# Patient Record
Sex: Female | Born: 1935 | Race: White | Hispanic: No | Marital: Married | Smoking: Never smoker
Health system: Southern US, Community
[De-identification: ages and names within clinical notes are randomized; demographics above are authoritative.]

## PROBLEM LIST (undated history)

## (undated) DIAGNOSIS — E119 Type 2 diabetes mellitus without complications: Secondary | ICD-10-CM

## (undated) DIAGNOSIS — R569 Unspecified convulsions: Secondary | ICD-10-CM

## (undated) DIAGNOSIS — I219 Acute myocardial infarction, unspecified: Secondary | ICD-10-CM

## (undated) DIAGNOSIS — Z8489 Family history of other specified conditions: Secondary | ICD-10-CM

## (undated) DIAGNOSIS — C801 Malignant (primary) neoplasm, unspecified: Secondary | ICD-10-CM

## (undated) DIAGNOSIS — M1990 Unspecified osteoarthritis, unspecified site: Secondary | ICD-10-CM

## (undated) DIAGNOSIS — R51 Headache: Secondary | ICD-10-CM

## (undated) DIAGNOSIS — I10 Essential (primary) hypertension: Secondary | ICD-10-CM

## (undated) DIAGNOSIS — I2510 Atherosclerotic heart disease of native coronary artery without angina pectoris: Secondary | ICD-10-CM

## (undated) DIAGNOSIS — T4145XA Adverse effect of unspecified anesthetic, initial encounter: Secondary | ICD-10-CM

## (undated) DIAGNOSIS — K859 Acute pancreatitis without necrosis or infection, unspecified: Secondary | ICD-10-CM

## (undated) DIAGNOSIS — F419 Anxiety disorder, unspecified: Secondary | ICD-10-CM

## (undated) DIAGNOSIS — T8859XA Other complications of anesthesia, initial encounter: Secondary | ICD-10-CM

## (undated) DIAGNOSIS — R519 Headache, unspecified: Secondary | ICD-10-CM

## (undated) HISTORY — PX: CARDIOVASCULAR STRESS TEST: SHX262

## (undated) HISTORY — PX: DILATION AND CURETTAGE OF UTERUS: SHX78

## (undated) HISTORY — PX: EYE SURGERY: SHX253

## (undated) HISTORY — PX: TRANSTHORACIC ECHOCARDIOGRAM: SHX275

---

## 2009-03-07 HISTORY — PX: CORONARY STENT PLACEMENT: SHX1402

## 2009-03-07 HISTORY — PX: CARDIAC CATHETERIZATION: SHX172

## 2015-05-30 ENCOUNTER — Emergency Department (HOSPITAL_COMMUNITY): Payer: Medicare Other

## 2015-05-30 ENCOUNTER — Inpatient Hospital Stay (HOSPITAL_COMMUNITY)
Admission: EM | Admit: 2015-05-30 | Discharge: 2015-06-03 | DRG: 443 | Disposition: A | Discharge status: Home / Self Care | Payer: Medicare Other | Attending: Internal Medicine | Admitting: Internal Medicine

## 2015-05-30 ENCOUNTER — Encounter (HOSPITAL_COMMUNITY): Payer: Self-pay

## 2015-05-30 DIAGNOSIS — Z7902 Long term (current) use of antithrombotics/antiplatelets: Secondary | ICD-10-CM

## 2015-05-30 DIAGNOSIS — R7989 Other specified abnormal findings of blood chemistry: Secondary | ICD-10-CM | POA: Diagnosis not present

## 2015-05-30 DIAGNOSIS — Z7984 Long term (current) use of oral hypoglycemic drugs: Secondary | ICD-10-CM

## 2015-05-30 DIAGNOSIS — E119 Type 2 diabetes mellitus without complications: Secondary | ICD-10-CM | POA: Diagnosis present

## 2015-05-30 DIAGNOSIS — I10 Essential (primary) hypertension: Secondary | ICD-10-CM | POA: Diagnosis present

## 2015-05-30 DIAGNOSIS — B179 Acute viral hepatitis, unspecified: Principal | ICD-10-CM | POA: Diagnosis present

## 2015-05-30 DIAGNOSIS — Z7982 Long term (current) use of aspirin: Secondary | ICD-10-CM

## 2015-05-30 DIAGNOSIS — Z955 Presence of coronary angioplasty implant and graft: Secondary | ICD-10-CM

## 2015-05-30 DIAGNOSIS — R7401 Elevation of levels of liver transaminase levels: Secondary | ICD-10-CM | POA: Diagnosis present

## 2015-05-30 DIAGNOSIS — K7200 Acute and subacute hepatic failure without coma: Secondary | ICD-10-CM | POA: Diagnosis not present

## 2015-05-30 DIAGNOSIS — Z01810 Encounter for preprocedural cardiovascular examination: Secondary | ICD-10-CM | POA: Diagnosis not present

## 2015-05-30 DIAGNOSIS — K810 Acute cholecystitis: Secondary | ICD-10-CM | POA: Diagnosis not present

## 2015-05-30 DIAGNOSIS — G40909 Epilepsy, unspecified, not intractable, without status epilepticus: Secondary | ICD-10-CM

## 2015-05-30 DIAGNOSIS — I2510 Atherosclerotic heart disease of native coronary artery without angina pectoris: Secondary | ICD-10-CM | POA: Diagnosis not present

## 2015-05-30 DIAGNOSIS — R740 Nonspecific elevation of levels of transaminase and lactic acid dehydrogenase [LDH]: Secondary | ICD-10-CM | POA: Diagnosis not present

## 2015-05-30 DIAGNOSIS — R1013 Epigastric pain: Secondary | ICD-10-CM | POA: Diagnosis not present

## 2015-05-30 DIAGNOSIS — E1100 Type 2 diabetes mellitus with hyperosmolarity without nonketotic hyperglycemic-hyperosmolar coma (NKHHC): Secondary | ICD-10-CM | POA: Diagnosis not present

## 2015-05-30 DIAGNOSIS — R945 Abnormal results of liver function studies: Secondary | ICD-10-CM

## 2015-05-30 HISTORY — DX: Essential (primary) hypertension: I10

## 2015-05-30 HISTORY — DX: Unspecified convulsions: R56.9

## 2015-05-30 HISTORY — DX: Acute pancreatitis without necrosis or infection, unspecified: K85.90

## 2015-05-30 HISTORY — DX: Type 2 diabetes mellitus without complications: E11.9

## 2015-05-30 HISTORY — DX: Atherosclerotic heart disease of native coronary artery without angina pectoris: I25.10

## 2015-05-30 LAB — I-STAT TROPONIN, ED: TROPONIN I, POC: 0 ng/mL (ref 0.00–0.08)

## 2015-05-30 LAB — CBC WITH DIFFERENTIAL/PLATELET
BASOS ABS: 0 10*3/uL (ref 0.0–0.1)
Basophils Relative: 0 %
EOS ABS: 0 10*3/uL (ref 0.0–0.7)
EOS PCT: 0 %
HCT: 37.9 % (ref 36.0–46.0)
Hemoglobin: 13.1 g/dL (ref 12.0–15.0)
LYMPHS ABS: 0.7 10*3/uL (ref 0.7–4.0)
Lymphocytes Relative: 6 %
MCH: 30.8 pg (ref 26.0–34.0)
MCHC: 34.6 g/dL (ref 30.0–36.0)
MCV: 89 fL (ref 78.0–100.0)
Monocytes Absolute: 0.8 10*3/uL (ref 0.1–1.0)
Monocytes Relative: 7 %
Neutro Abs: 10 10*3/uL — ABNORMAL HIGH (ref 1.7–7.7)
Neutrophils Relative %: 87 %
PLATELETS: 210 10*3/uL (ref 150–400)
RBC: 4.26 MIL/uL (ref 3.87–5.11)
RDW: 12.9 % (ref 11.5–15.5)
WBC: 11.5 10*3/uL — AB (ref 4.0–10.5)

## 2015-05-30 LAB — COMPREHENSIVE METABOLIC PANEL
ALT: 435 U/L — AB (ref 14–54)
AST: 799 U/L — ABNORMAL HIGH (ref 15–41)
Albumin: 3.9 g/dL (ref 3.5–5.0)
Alkaline Phosphatase: 91 U/L (ref 38–126)
Anion gap: 12 (ref 5–15)
BUN: 10 mg/dL (ref 6–20)
CHLORIDE: 96 mmol/L — AB (ref 101–111)
CO2: 22 mmol/L (ref 22–32)
CREATININE: 0.82 mg/dL (ref 0.44–1.00)
Calcium: 9.8 mg/dL (ref 8.9–10.3)
GFR calc non Af Amer: >60 mL/min (ref >60)
Glucose, Bld: 209 mg/dL — ABNORMAL HIGH (ref 65–99)
Potassium: 4.3 mmol/L (ref 3.5–5.1)
SODIUM: 130 mmol/L — AB (ref 135–145)
Total Bilirubin: 3.5 mg/dL — ABNORMAL HIGH (ref 0.3–1.2)
Total Protein: 7 g/dL (ref 6.5–8.1)

## 2015-05-30 LAB — LIPASE, BLOOD: Lipase: 29 U/L (ref 11–51)

## 2015-05-30 LAB — PROTIME-INR
INR: 1.18 (ref 0.00–1.49)
PROTHROMBIN TIME: 15.2 s (ref 11.6–15.2)

## 2015-05-30 MED ORDER — SODIUM CHLORIDE 0.9 % IV SOLN
INTRAVENOUS | Status: DC
  Administered 2015-05-31: 01:00:00 via INTRAVENOUS

## 2015-05-30 MED ORDER — LOSARTAN POTASSIUM 50 MG PO TABS
100.0000 mg | ORAL_TABLET | Freq: Every day | ORAL | Status: DC
  Administered 2015-05-31 – 2015-06-03 (×4): 100 mg via ORAL
  Filled 2015-05-30 (×4): qty 2

## 2015-05-30 MED ORDER — GABAPENTIN 100 MG PO CAPS
100.0000 mg | ORAL_CAPSULE | Freq: Every day | ORAL | Status: DC
  Administered 2015-05-31 – 2015-06-02 (×4): 100 mg via ORAL
  Filled 2015-05-30 (×4): qty 1

## 2015-05-30 MED ORDER — SENNOSIDES-DOCUSATE SODIUM 8.6-50 MG PO TABS
1.0000 | ORAL_TABLET | Freq: Every day | ORAL | Status: DC
  Administered 2015-05-31 – 2015-06-02 (×3): 1 via ORAL
  Filled 2015-05-30 (×3): qty 1

## 2015-05-30 MED ORDER — PANTOPRAZOLE SODIUM 40 MG PO TBEC
40.0000 mg | DELAYED_RELEASE_TABLET | Freq: Every day | ORAL | Status: DC
  Administered 2015-05-31 – 2015-06-03 (×4): 40 mg via ORAL
  Filled 2015-05-30 (×4): qty 1

## 2015-05-30 MED ORDER — INSULIN ASPART 100 UNIT/ML ~~LOC~~ SOLN
0.0000 [IU] | SUBCUTANEOUS | Status: DC
  Administered 2015-05-31: 2 [IU] via SUBCUTANEOUS

## 2015-05-30 MED ORDER — ASPIRIN EC 81 MG PO TBEC
81.0000 mg | DELAYED_RELEASE_TABLET | Freq: Every day | ORAL | Status: DC
  Administered 2015-05-31 – 2015-06-02 (×3): 81 mg via ORAL
  Filled 2015-05-30 (×3): qty 1

## 2015-05-30 MED ORDER — SODIUM CHLORIDE 1 G PO TABS
1.0000 g | ORAL_TABLET | ORAL | Status: DC
Start: 2015-05-31 — End: 2015-05-31
  Filled 2015-05-30: qty 1

## 2015-05-30 MED ORDER — METOPROLOL SUCCINATE ER 50 MG PO TB24
50.0000 mg | ORAL_TABLET | Freq: Every day | ORAL | Status: DC
  Administered 2015-05-31 – 2015-06-03 (×4): 50 mg via ORAL
  Filled 2015-05-30 (×4): qty 1

## 2015-05-30 MED ORDER — ONDANSETRON HCL 4 MG/2ML IJ SOLN: 4.0000 mg | Freq: Four times a day (QID) | INTRAMUSCULAR | Status: DC | PRN

## 2015-05-30 MED ORDER — CLOPIDOGREL BISULFATE 75 MG PO TABS
75.0000 mg | ORAL_TABLET | Freq: Every day | ORAL | Status: DC
  Administered 2015-05-31: 75 mg via ORAL
  Filled 2015-05-30: qty 1

## 2015-05-30 MED ORDER — ENOXAPARIN SODIUM 40 MG/0.4ML ~~LOC~~ SOLN
40.0000 mg | SUBCUTANEOUS | Status: DC
  Administered 2015-05-31 – 2015-06-02 (×2): 40 mg via SUBCUTANEOUS
  Filled 2015-05-30 (×2): qty 0.4

## 2015-05-30 MED ORDER — IOPAMIDOL (ISOVUE-300) INJECTION 61%
INTRAVENOUS | Status: AC
  Administered 2015-05-30: 100 mL
  Filled 2015-05-30: qty 100

## 2015-05-30 MED ORDER — METOCLOPRAMIDE HCL 5 MG/ML IJ SOLN
5.0000 mg | Freq: Once | INTRAMUSCULAR | Status: AC
  Administered 2015-05-30: 5 mg via INTRAVENOUS
  Filled 2015-05-30: qty 2

## 2015-05-30 MED ORDER — ADULT MULTIVITAMIN W/MINERALS CH
1.0000 | ORAL_TABLET | Freq: Every day | ORAL | Status: DC
  Administered 2015-06-01 – 2015-06-03 (×3): 1 via ORAL
  Filled 2015-05-30 (×3): qty 1

## 2015-05-30 MED ORDER — SODIUM CHLORIDE 0.9 % IV BOLUS (SEPSIS)
1000.0000 mL | Freq: Once | INTRAVENOUS | Status: AC
  Administered 2015-05-30: 1000 mL via INTRAVENOUS

## 2015-05-30 MED ORDER — CYCLOBENZAPRINE HCL 5 MG PO TABS: 5.0000 mg | ORAL_TABLET | Freq: Three times a day (TID) | ORAL | Status: DC | PRN

## 2015-05-30 MED ORDER — LEVETIRACETAM 500 MG PO TABS
500.0000 mg | ORAL_TABLET | Freq: Two times a day (BID) | ORAL | Status: DC
  Administered 2015-05-31 – 2015-06-03 (×8): 500 mg via ORAL
  Filled 2015-05-30 (×2): qty 1
  Filled 2015-05-30: qty 2
  Filled 2015-05-30 (×3): qty 1
  Filled 2015-05-30: qty 2
  Filled 2015-05-30: qty 1

## 2015-05-30 MED ORDER — CLONAZEPAM 0.5 MG PO TABS: 0.5000 mg | ORAL_TABLET | Freq: Two times a day (BID) | ORAL | Status: DC | PRN

## 2015-05-30 NOTE — ED Provider Notes (Signed)
Complains of generalized weakness, nausea and vomiting 1 time and epigastric pain onset this morning. Last bowel movement this morning was normal. She was evaluated by an after hours clinic earlier today determined to have elevated liver function tests and sent here for further evaluation. Presently abdominal discomfort is minimal. She does feel mildly nauseated. She denies any chest pain. On exam no distress lungs clear auscultation heart regular rate and rhythm abdomen nondistended normal active bowel sounds minimal tenderness at epigastrium no guarding rigidity or rebound  Orlie Dakin, MD 05/30/15 2021

## 2015-05-30 NOTE — ED Provider Notes (Signed)
CSN: [id number]     Arrival date & time 05/30/15  1906 History   First MD Initiated Contact with Patient 05/30/15 1930     Chief Complaint  Patient presents with  . Abnormal Lab      HPI 82 y.o. female with history of hypertension, DM 2, and pancreatitis back in December of this year of unknown etiology, he presents with generalized weakness, nausea, vomiting, epigastric pain for the past day. Patient was seen by her primary care doctor this morning and perform labs that showed elevated liver enzymes, prompting him to request the patient be seen in the emergency department. She reports that her epigastric abdominal pain is completely resolved. Reports normal bowel movements. Denies diarrhea or constipation. No blood in her stool or black tarry stools. Vomiting has been nonbloody nonbilious. She has not tolerated oral intake throughout the day today. Denies fevers, right upper quadrant pain, right lower quadrant pain, history of abdominal surgeries. Denies dysuria, frequency, chest pain, shortness of breath.   Past Medical History  Diagnosis Date  . Hypertension   . Diabetes mellitus without complication ([place])   . Seizures ([employer])    History reviewed. No pertinent past surgical history. History reviewed. No pertinent family history. Social History  Substance Use Topics  . Smoking status: Never Smoker   . Smokeless tobacco: Never Used  . Alcohol Use: No   OB History    No data available     Review of Systems  Constitutional: Negative for fever, chills, activity change and appetite change.  HENT: Negative for congestion, rhinorrhea and sore throat.   Eyes: Negative for visual disturbance.  Respiratory: Negative for cough, shortness of breath and wheezing.   Cardiovascular: Negative for chest pain and palpitations.  Gastrointestinal: Positive for nausea, vomiting and abdominal pain. Negative for diarrhea, constipation, blood in stool and abdominal distention.  Genitourinary: Negative  for dysuria, frequency and flank pain.  Musculoskeletal: Negative for myalgias, back pain, joint swelling, arthralgias, gait problem, neck pain and neck stiffness.  Skin: Negative for rash.  Neurological: Positive for weakness (generalized). Negative for dizziness, tremors, syncope, facial asymmetry, speech difficulty, numbness and headaches.  Psychiatric/Behavioral: Negative for behavioral problems, confusion and agitation.      Allergies  Penicillins  Home Medications   Prior to Admission medications   Medication Sig Start Date End Date Taking? Authorizing Provider  aspirin EC 81 MG tablet Take 81 mg by mouth daily.   Yes Historical Provider, MD  clonazePAM (KLONOPIN) 0.5 MG tablet Take 0.5 mg by mouth 2 (two) times daily as needed for anxiety.   Yes Historical Provider, MD  clopidogrel (PLAVIX) 75 MG tablet Take 75 mg by mouth daily.   Yes Historical Provider, MD  cyclobenzaprine (FLEXERIL) 5 MG tablet Take 5 mg by mouth 3 (three) times daily as needed for muscle spasms.   Yes Historical Provider, MD  gabapentin (NEURONTIN) 100 MG capsule Take 100 mg by mouth at bedtime.   Yes Historical Provider, MD  levETIRAcetam (KEPPRA) 500 MG tablet Take 500 mg by mouth 2 (two) times daily.   Yes Historical Provider, MD  losartan (COZAAR) 100 MG tablet Take 100 mg by mouth daily.   Yes Historical Provider, MD  metFORMIN (GLUCOPHAGE) 500 MG tablet Take 500 mg by mouth 2 (two) times daily with a meal.   Yes Historical Provider, MD  metoprolol succinate (TOPROL-XL) 50 MG 24 hr tablet Take 50 mg by mouth daily. Take with or immediately following a meal.   Yes Historical Provider,  MD  Multiple Vitamin (MULTIVITAMIN WITH MINERALS) TABS tablet Take 1 tablet by mouth daily with lunch.   Yes Historical Provider, MD  omeprazole (PRILOSEC) 20 MG capsule Take 20 mg by mouth daily.   Yes Historical Provider, MD  ondansetron (ZOFRAN) 4 MG tablet Take 4 mg by mouth every 8 (eight) hours as needed for nausea or  vomiting.   Yes Historical Provider, MD  OVER THE COUNTER MEDICATION Place 1 drop into both eyes daily as needed (dry eyes). OTC lubricating eye drop   Yes Historical Provider, MD  senna-docusate (SENOKOT-S) 8.6-50 MG tablet Take 1 tablet by mouth at bedtime.   Yes Historical Provider, MD  sodium chloride 1 g tablet Take 1 g by mouth every other day.   Yes Historical Provider, MD  spironolactone (ALDACTONE) 25 MG tablet Take 25 mg by mouth daily.   Yes Historical Provider, MD   BP 104/62 mmHg  Pulse 90  Temp(Src) 99.4 F (37.4 C) (Oral)  Resp 26  SpO2 97% Physical Exam  Constitutional: She is oriented to person, place, and time. She appears well-developed and well-nourished. No distress.  HENT:  Head: Normocephalic and atraumatic.  Right Ear: External ear normal.  Left Ear: External ear normal.  Nose: Nose normal.  Mouth/Throat: Oropharynx is clear and moist. No oropharyngeal exudate.  Eyes: Conjunctivae and EOM are normal. Pupils are equal, round, and reactive to light. Right eye exhibits no discharge. Left eye exhibits no discharge.  Neck: Normal range of motion. Neck supple.  Cardiovascular: Normal rate, regular rhythm and normal heart sounds.  Exam reveals no gallop and no friction rub.   No murmur heard. Pulmonary/Chest: Breath sounds normal. No respiratory distress. She has no wheezes. She has no rales.  Abdominal: Soft. Bowel sounds are normal. She exhibits no distension and no mass. There is tenderness. There is no rebound (mild TTP over the epigastrium) and no guarding.  Musculoskeletal: Normal range of motion. She exhibits no edema or tenderness.  Neurological: She is alert and oriented to person, place, and time. She exhibits normal muscle tone.  Skin: Skin is warm and dry. No rash noted. She is not diaphoretic.  Psychiatric: She has a normal mood and affect. Her behavior is normal. Judgment and thought content normal.    ED Course  Procedures (including critical care  time) Labs Review Labs Reviewed  CBC WITH DIFFERENTIAL/PLATELET - Abnormal; Notable for the following:    WBC 11.5 (*)    Neutro Abs 10.0 (*)    All other components within normal limits  COMPREHENSIVE METABOLIC PANEL - Abnormal; Notable for the following:    Sodium 130 (*)    Chloride 96 (*)    Glucose, Bld 209 (*)    AST 799 (*)    ALT 435 (*)    Total Bilirubin 3.5 (*)    All other components within normal limits  LIPASE, BLOOD  PROTIME-INR  HEPATITIS PANEL, ACUTE  URINALYSIS, ROUTINE W REFLEX MICROSCOPIC (NOT AT [facility])  EPSTEIN-BARR VIRUS VCA, IGG  EPSTEIN-BARR VIRUS VCA, IGM  CMV IGM  COMPREHENSIVE METABOLIC PANEL  I-STAT TROPOININ, ED    Imaging Review Ct Abdomen Pelvis W Contrast  05/30/2015  CLINICAL DATA:  Epigastric abdominal pain and nausea and vomiting today. Elevated liver enzymes at a walk in clinic. EXAM: CT ABDOMEN AND PELVIS WITH CONTRAST TECHNIQUE: Multidetector CT imaging of the abdomen and pelvis was performed using the standard protocol following bolus administration of intravenous contrast. CONTRAST:  1 ISOVUE-300 IOPAMIDOL (ISOVUE-300) INJECTION 61% COMPARISON:  None. FINDINGS: Tiny subpleural nodules and calcified granulomas demonstrated in the lung bases, likely postinflammatory. Diffuse fatty infiltration of the liver. Mild bile duct dilatation is likely normal for patient age. Gallbladder, pancreas, spleen, kidneys, abdominal aorta, inferior vena cava, and retroperitoneal lymph nodes are unremarkable. Large mostly fatty density mass in the right adrenal gland measuring 4.3 cm diameter. 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 The stomach, small bowel, and colon are mostly decompressed. Scattered stool in the colon. There is a vague infiltration in the root of the mesentery. This is nonspecific but could indicate inflammatory process such as mesenteritis. No free air or free fluid in the abdomen. Pelvis: The appendix is normal. Bladder wall is not thickened. Bladder  is distended, possibly physiologic or possibly indicating outlet obstruction. Uterus and ovaries are not enlarged. Increased density in the uterus suggesting fibroid. No other pelvic mass or lymphadenopathy. No free or loculated pelvic fluid collections. Degenerative changes in the spine and hips. Compression of the superior endplate of L3 with approximately 50% loss of height. Suggestion of mild cortical irregularity. This may represent acute compression. No destructive bone lesions. IMPRESSION: Diffuse fatty infiltration of the liver. 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 Vague infiltration in the root of the mesenteric possibly indicating mesenteritis. Nonspecific distention of the bladder. Compression of the superior endplate of L3, possibly acute compression. Electronically Signed   By: Lucienne Capers M.D.   On: 05/30/2015 22:51   I have personally reviewed and evaluated these images and lab results as part of my medical decision-making.   EKG Interpretation   Date/Time:  Saturday May 30 2015 20:37:55 EDT Ventricular Rate:  97 PR Interval:  218 QRS Duration: 139 QT Interval:  375 QTC Calculation: 476 R Axis:   2 Text Interpretation:  Sinus rhythm Prolonged PR interval Right bundle  branch block No old tracing to compare Confirmed by Winfred Leeds  MD, SAM  [phone]) on 05/30/2015 8:44:34 PM      MDM   Final diagnoses:  Acute hepatitis   Patient is generally well-appearing on arrival. Vital signs stable. Mild epigastric abdominal pain. Nonpalpable liver edge. Labs obtained that revealed markedly elevated liver enzymes. CT abdomen pelvis with diffuse fatty infiltration of the liver, and right adrenal gland mass. Patient denies recent travel outside the country. No known sick contacts. Symptoms are likely secondary to hepatitis, etiology unknown. She is not tolerating oral intake, so will admit to the hospitalist service for further workup and management. Lipase  within normal limits making pancreatitis unlikely. IV fluids given while in the ED. Stable for transfer.    Jenifer Algernon Huxley, MD 05/31/15 [id number]  Orlie Dakin, MD 05/31/15 [phone]

## 2015-05-30 NOTE — ED Notes (Signed)
Patient arrived via POV from PCP office due to abnormal liver enzyme.

## 2015-05-30 NOTE — H&P (Addendum)
[facility] History and Physical  Brittany Boyle [id number] DOB: May 04, 1921 DOA: 05/30/2015  Referring physician: EDP PCP: No primary care provider on file.   Chief Complaint: Elevated LFTs   HPI: Brittany Boyle is a 81 y.o. female with h/o DM, HTN, seizures.  Patient lives at home.  Patient presents to ED with c/o N/V and epigastric abdominal pain.  Symptoms onset this morning, unable to keep any food down.  Patient went to UC (results in care everywhere) where her LFTs were noted to be elevated.  She was sent to the ED where her LFTs were noted to be even more elevated.  Review of Systems: Systems reviewed.  As above, otherwise negative  Past Medical History  Diagnosis Date  . Hypertension   . Diabetes mellitus without complication ([place])   . Seizures ([place])    History reviewed. No pertinent past surgical history. Social History:  reports that she has never smoked. She has never used smokeless tobacco. She reports that she does not drink alcohol or use illicit drugs.  Allergies  Allergen Reactions  . Penicillins Rash    Has patient had a PCN reaction causing immediate rash, facial/tongue/throat swelling, SOB or lightheadedness with hypotension: Yes Has patient had a PCN reaction causing severe rash involving mucus membranes or skin necrosis: No Has patient had a PCN reaction that required hospitalization No Has patient had a PCN reaction occurring within the last 10 years: No If all of the above answers are "NO", then may proceed with Cephalosporin use.    History reviewed. No pertinent family history.   Prior to Admission medications   Medication Sig Start Date End Date Taking? Authorizing Provider  aspirin EC 81 MG tablet Take 81 mg by mouth daily.   Yes Historical Provider, MD  clonazePAM (KLONOPIN) 0.5 MG tablet Take 0.5 mg by mouth 2 (two) times daily as needed for anxiety.   Yes Historical Provider, MD  clopidogrel (PLAVIX) 75 MG tablet Take 75 mg by mouth daily.    Yes Historical Provider, MD  cyclobenzaprine (FLEXERIL) 5 MG tablet Take 5 mg by mouth 3 (three) times daily as needed for muscle spasms.   Yes Historical Provider, MD  gabapentin (NEURONTIN) 100 MG capsule Take 100 mg by mouth at bedtime.   Yes Historical Provider, MD  levETIRAcetam (KEPPRA) 500 MG tablet Take 500 mg by mouth 2 (two) times daily.   Yes Historical Provider, MD  losartan (COZAAR) 100 MG tablet Take 100 mg by mouth daily.   Yes Historical Provider, MD  metFORMIN (GLUCOPHAGE) 500 MG tablet Take 500 mg by mouth 2 (two) times daily with a meal.   Yes Historical Provider, MD  metoprolol succinate (TOPROL-XL) 50 MG 24 hr tablet Take 50 mg by mouth daily. Take with or immediately following a meal.   Yes Historical Provider, MD  Multiple Vitamin (MULTIVITAMIN WITH MINERALS) TABS tablet Take 1 tablet by mouth daily with lunch.   Yes Historical Provider, MD  omeprazole (PRILOSEC) 20 MG capsule Take 20 mg by mouth daily.   Yes Historical Provider, MD  ondansetron (ZOFRAN) 4 MG tablet Take 4 mg by mouth every 8 (eight) hours as needed for nausea or vomiting.   Yes Historical Provider, MD  OVER THE COUNTER MEDICATION Place 1 drop into both eyes daily as needed (dry eyes). OTC lubricating eye drop   Yes Historical Provider, MD  senna-docusate (SENOKOT-S) 8.6-50 MG tablet Take 1 tablet by mouth at bedtime.   Yes Historical Provider, MD  sodium chloride 1  g tablet Take 1 g by mouth every other day.   Yes Historical Provider, MD  spironolactone (ALDACTONE) 25 MG tablet Take 25 mg by mouth daily.   Yes Historical Provider, MD   Physical Exam: Filed Vitals:   05/30/15 2200 05/30/15 2245  BP: 137/56 104/62  Pulse: 94 90  Temp:    Resp: 28 26    BP 104/62 mmHg  Pulse 90  Temp(Src) 99.4 F (37.4 C) (Oral)  Resp 26  SpO2 97%  General Appearance:    Alert, oriented, no distress, appears stated age  Head:    Normocephalic, atraumatic  Eyes:    PERRL, EOMI, sclera non-icteric        Nose:    Nares without drainage or epistaxis. Mucosa, turbinates normal  Throat:   Moist mucous membranes. Oropharynx without erythema or exudate.  Neck:   Supple. No carotid bruits.  No thyromegaly.  No lymphadenopathy.   Back:     No CVA tenderness, no spinal tenderness  Lungs:     Clear to auscultation bilaterally, without wheezes, rhonchi or rales  Chest wall:    No tenderness to palpitation  Heart:    Regular rate and rhythm without murmurs, gallops, rubs  Abdomen:     Soft, non-tender, nondistended, normal bowel sounds, no organomegaly  Genitalia:    deferred  Rectal:    deferred  Extremities:   No clubbing, cyanosis or edema.  Pulses:   2+ and symmetric all extremities  Skin:   Skin color, texture, turgor normal, no rashes or lesions  Lymph nodes:   Cervical, supraclavicular, and axillary nodes normal  Neurologic:   CNII-XII intact. Normal strength, sensation and reflexes      throughout    Labs on Admission:  Basic Metabolic Panel:  Recent Labs Lab 05/30/15 2031  NA 130*  K 4.3  CL 96*  CO2 22  GLUCOSE 209*  BUN 10  CREATININE 0.82  CALCIUM 9.8   Liver Function Tests:  Recent Labs Lab 05/30/15 2031  AST 799*  ALT 435*  ALKPHOS 91  BILITOT 3.5*  PROT 7.0  ALBUMIN 3.9    Recent Labs Lab 05/30/15 2031  LIPASE 29   No results for input(s): AMMONIA in the last 168 hours. CBC:  Recent Labs Lab 05/30/15 2031  WBC 11.5*  NEUTROABS 10.0*  HGB 13.1  HCT 37.9  MCV 89.0  PLT 210   Cardiac Enzymes: No results for input(s): CKTOTAL, CKMB, CKMBINDEX, TROPONINI in the last 168 hours.  BNP (last 3 results) No results for input(s): PROBNP in the last 8760 hours. CBG: No results for input(s): GLUCAP in the last 168 hours.  Radiological Exams on Admission: Ct Abdomen Pelvis W Contrast  05/30/2015  CLINICAL DATA:  Epigastric abdominal pain and nausea and vomiting today. Elevated liver enzymes at a walk in clinic. EXAM: CT ABDOMEN AND PELVIS WITH CONTRAST TECHNIQUE:  Multidetector CT imaging of the abdomen and pelvis was performed using the standard protocol following bolus administration of intravenous contrast. CONTRAST:  1 ISOVUE-300 IOPAMIDOL (ISOVUE-300) INJECTION 61% COMPARISON:  None. FINDINGS: Tiny subpleural nodules and calcified granulomas demonstrated in the lung bases, likely postinflammatory. Diffuse fatty infiltration of the liver. Mild bile duct dilatation is likely normal for patient age. Gallbladder, pancreas, spleen, kidneys, abdominal aorta, inferior vena cava, and retroperitoneal lymph nodes are unremarkable. Large mostly fatty density mass in the right adrenal gland measuring 4.3 cm diameter. 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 The stomach, small bowel, and colon are mostly decompressed.  Scattered stool in the colon. There is a vague infiltration in the root of the mesentery. This is nonspecific but could indicate inflammatory process such as mesenteritis. No free air or free fluid in the abdomen. Pelvis: The appendix is normal. Bladder wall is not thickened. Bladder is distended, possibly physiologic or possibly indicating outlet obstruction. Uterus and ovaries are not enlarged. Increased density in the uterus suggesting fibroid. No other pelvic mass or lymphadenopathy. No free or loculated pelvic fluid collections. Degenerative changes in the spine and hips. Compression of the superior endplate of L3 with approximately 50% loss of height. Suggestion of mild cortical irregularity. This may represent acute compression. No destructive bone lesions. IMPRESSION: Diffuse fatty infiltration of the liver. 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 Vague infiltration in the root of the mesenteric possibly indicating mesenteritis. Nonspecific distention of the bladder. Compression of the superior endplate of L3, possibly acute compression. Electronically Signed   By: Lucienne Capers M.D.   On: 05/30/2015 22:51    EKG: Independently  reviewed.  Assessment/Plan Principal Problem:   Acute hepatitis Active Problems:   Transaminitis   DM2 (diabetes mellitus, type 2) ([place])   1. Acute hepatitis - work up suspicious for some sort of acute viral hepatitis 1. Hepatitis panel acute 2. CMV 3. EBV (though this seems extremely unlikely in a 82 year old) 4. IVF 5. Clear liquids 6. Consider GI consult in AM 7. Repeat CMP in AM 2. Incidental findings of adrenal mass - appears to be most likely a benign lipoma per the CT read. 3. DM2 - 1. Holding metformin 2. Sensitive scale SSI q4h    Code Status: Full  Family Communication: Family at bedside Disposition Plan: Admit to inpatient   Time spent: 66 min  Tonie Elsey M. [facility] Pager [phone]  If 7AM-7PM, please contact the day team taking care of the patient [email or web address] Password [facility] 05/30/2015, 11:59 PM

## 2015-05-31 ENCOUNTER — Encounter (HOSPITAL_COMMUNITY): Payer: Self-pay | Admitting: General Practice

## 2015-05-31 ENCOUNTER — Inpatient Hospital Stay (HOSPITAL_COMMUNITY): Payer: Medicare Other

## 2015-05-31 DIAGNOSIS — I10 Essential (primary) hypertension: Secondary | ICD-10-CM | POA: Diagnosis present

## 2015-05-31 DIAGNOSIS — G40909 Epilepsy, unspecified, not intractable, without status epilepticus: Secondary | ICD-10-CM

## 2015-05-31 LAB — COMPREHENSIVE METABOLIC PANEL
ALBUMIN: 3.4 g/dL — AB (ref 3.5–5.0)
ALT: 421 U/L — AB (ref 14–54)
AST: 498 U/L — AB (ref 15–41)
Alkaline Phosphatase: 79 U/L (ref 38–126)
Anion gap: 10 (ref 5–15)
BILIRUBIN TOTAL: 4.1 mg/dL — AB (ref 0.3–1.2)
BUN: 7 mg/dL (ref 6–20)
CO2: 24 mmol/L (ref 22–32)
CREATININE: 0.75 mg/dL (ref 0.44–1.00)
Calcium: 9.6 mg/dL (ref 8.9–10.3)
Chloride: 101 mmol/L (ref 101–111)
GFR calc Af Amer: >60 mL/min (ref >60)
GLUCOSE: 161 mg/dL — AB (ref 65–99)
POTASSIUM: 4.2 mmol/L (ref 3.5–5.1)
Sodium: 135 mmol/L (ref 135–145)
TOTAL PROTEIN: 6.6 g/dL (ref 6.5–8.1)

## 2015-05-31 LAB — GLUCOSE, CAPILLARY
GLUCOSE-CAPILLARY: 155 mg/dL — AB (ref 65–99)
GLUCOSE-CAPILLARY: 116 mg/dL — AB (ref 65–99)
GLUCOSE-CAPILLARY: 127 mg/dL — AB (ref 65–99)
GLUCOSE-CAPILLARY: 179 mg/dL — AB (ref 65–99)
Glucose-Capillary: 188 mg/dL — ABNORMAL HIGH (ref 65–99)

## 2015-05-31 LAB — ACETAMINOPHEN LEVEL

## 2015-05-31 MED ORDER — SODIUM CHLORIDE 1 G PO TABS
1.0000 g | ORAL_TABLET | ORAL | Status: DC
  Administered 2015-06-02: 1 g via ORAL
  Filled 2015-05-31 (×2): qty 1

## 2015-05-31 MED ORDER — INSULIN ASPART 100 UNIT/ML ~~LOC~~ SOLN
0.0000 [IU] | Freq: Three times a day (TID) | SUBCUTANEOUS | Status: DC
  Administered 2015-05-31: 2 [IU] via SUBCUTANEOUS
  Administered 2015-06-01: 1 [IU] via SUBCUTANEOUS
  Administered 2015-06-02: 3 [IU] via SUBCUTANEOUS
  Administered 2015-06-02: 2 [IU] via SUBCUTANEOUS
  Administered 2015-06-02: 1 [IU] via SUBCUTANEOUS
  Administered 2015-06-03: 3 [IU] via SUBCUTANEOUS
  Administered 2015-06-03: 1 [IU] via SUBCUTANEOUS

## 2015-05-31 NOTE — Progress Notes (Signed)
[facility] PROGRESS NOTE  Brittany Boyle [id number] DOB: 07/21/1921 DOA: 05/30/2015 PCP: Verdell Carmine., MD  Summary 82 y.o. female with h/o DM, HTN, seizures. Patient lives at home. Patient presents to ED with c/o N/V and epigastric abdominal pain. Symptoms onset this morning, unable to keep any food down. Patient went to UC (results in care everywhere) where her LFTs were noted to be elevated. She was sent to the ED where her LFTs were noted to be even more elevated.   Assessment/Plan:  Principal Problem:   Acute hepatitis: ? Viral? Workup pending. CAT scan, there is mild ductal dilatation, possibly normal for age. Patient however reports that she thinks she may have had gallbladder problems, and there was consideration for cholecystectomy when she was admitted to [facility] a few months ago. Alkaline phosphatase is normal, but will check right upper quadrant ultrasound to rule out cholelithiasis and biliary obstruction.  will ask for records from [facility]. Review of home medications show nothing with a high likelihood of causing increased LFTs. LFTs are coming down. Patient is tolerating a clear liquid diet. Will advance to solids. Also, check acetaminophen level. Does have fatty liver on CAT scan Active Problems:   DM2 (diabetes mellitus, type 2) ([place]): Continue sliding scale for now   Seizure disorder [facility])   Essential hypertension controlled  Will get physical therapy evaluation, as she is elderly and complained of weakness as part of her chief complaint.  Code Status:  full Family Communication:  Daughter at bedside Disposition Plan:  Home 1-2 days if improving  Consultants:    Procedures:     Antibiotics:    HPI/Subjective: Tolerating clears. No abdominal pain. No vomiting or diarrhea. Cannot recall if any medications were changed recently. Thinks somebody may have mentioned cholecystectomy during one of her previous  hospitalizations, but cannot recall the details.   Objective: Filed Vitals:   05/31/15 0045 05/31/15 0504  BP: 129/57 125/51  Pulse: 87 84  Temp:  99.3 F (37.4 C)  Resp: 26 16    Intake/Output Summary (Last 24 hours) at 05/31/15 0831 Last data filed at 05/31/15 0600  Gross per 24 hour  Intake    510 ml  Output      0 ml  Net    510 ml   There were no vitals filed for this visit.  Exam:   General:  Elderly. Cooperative. Slow to respond and difficult historian. Oriented however  HEENT: Moist mucous membranes. No thrush. Sclera non-icteric  Cardiovascular: Regular rate rhythm without murmurs gallops rubs  Respiratory: Clear to auscultation bilaterally without wheezes rhonchi or rales  Abdomen: Soft nontender nondistended no hepatosplenomegaly  Ext: No clubbing cyanosis or edema  Skin: No definite jaundice  Basic Metabolic Panel:  Recent Labs Lab 05/30/15 2031 05/31/15 0209  NA 130* 135  K 4.3 4.2  CL 96* 101  CO2 22 24  GLUCOSE 209* 161*  BUN 10 7  CREATININE 0.82 0.75  CALCIUM 9.8 9.6   Liver Function Tests:  Recent Labs Lab 05/30/15 2031 05/31/15 0209  AST 799* 498*  ALT 435* 421*  ALKPHOS 91 79  BILITOT 3.5* 4.1*  PROT 7.0 6.6  ALBUMIN 3.9 3.4*    Recent Labs Lab 05/30/15 2031  LIPASE 29   No results for input(s): AMMONIA in the last 168 hours. CBC:  Recent Labs Lab 05/30/15 2031  WBC 11.5*  NEUTROABS 10.0*  HGB 13.1  HCT 37.9  MCV 89.0  PLT 210  Cardiac Enzymes: No results for input(s): CKTOTAL, CKMB, CKMBINDEX, TROPONINI in the last 168 hours. BNP (last 3 results) No results for input(s): BNP in the last 8760 hours.  ProBNP (last 3 results) No results for input(s): PROBNP in the last 8760 hours.  CBG:  Recent Labs Lab 05/31/15 0505 05/31/15 0728  GLUCAP 155* 116*    No results found for this or any previous visit (from the past 240 hour(s)).   Studies: Ct Abdomen Pelvis W Contrast  05/30/2015  CLINICAL DATA:   Epigastric abdominal pain and nausea and vomiting today. Elevated liver enzymes at a walk in clinic. EXAM: CT ABDOMEN AND PELVIS WITH CONTRAST TECHNIQUE: Multidetector CT imaging of the abdomen and pelvis was performed using the standard protocol following bolus administration of intravenous contrast. CONTRAST:  1 ISOVUE-300 IOPAMIDOL (ISOVUE-300) INJECTION 61% COMPARISON:  None. FINDINGS: Tiny subpleural nodules and calcified granulomas demonstrated in the lung bases, likely postinflammatory. Diffuse fatty infiltration of the liver. Mild bile duct dilatation is likely normal for patient age. Gallbladder, pancreas, spleen, kidneys, abdominal aorta, inferior vena cava, and retroperitoneal lymph nodes are unremarkable. Large mostly fatty density mass in the right adrenal gland measuring 4.3 cm diameter. 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 The stomach, small bowel, and colon are mostly decompressed. Scattered stool in the colon. There is a vague infiltration in the root of the mesentery. This is nonspecific but could indicate inflammatory process such as mesenteritis. No free air or free fluid in the abdomen. Pelvis: The appendix is normal. Bladder wall is not thickened. Bladder is distended, possibly physiologic or possibly indicating outlet obstruction. Uterus and ovaries are not enlarged. Increased density in the uterus suggesting fibroid. No other pelvic mass or lymphadenopathy. No free or loculated pelvic fluid collections. Degenerative changes in the spine and hips. Compression of the superior endplate of L3 with approximately 50% loss of height. Suggestion of mild cortical irregularity. This may represent acute compression. No destructive bone lesions. IMPRESSION: Diffuse fatty infiltration of the liver. 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 Vague infiltration in the root of the mesenteric possibly indicating mesenteritis. Nonspecific distention of the bladder. Compression of  the superior endplate of L3, possibly acute compression. Electronically Signed   By: Lucienne Capers M.D.   On: 05/30/2015 22:51    Scheduled Meds: . aspirin EC  81 mg Oral Daily  . clopidogrel  75 mg Oral Daily  . enoxaparin (LOVENOX) injection  40 mg Subcutaneous Q24H  . gabapentin  100 mg Oral QHS  . insulin aspart  0-9 Units Subcutaneous TID WC  . levETIRAcetam  500 mg Oral BID  . losartan  100 mg Oral Daily  . metoprolol succinate  50 mg Oral Daily  . multivitamin with minerals  1 tablet Oral Q lunch  . pantoprazole  40 mg Oral Daily  . senna-docusate  1 tablet Oral QHS  . sodium chloride  1 g Oral QODAY   Continuous Infusions:   Time spent: 35 minutes  [facility] [email or web address], password [facility] 05/31/2015, 8:31 AM  LOS: 1 day

## 2015-06-01 ENCOUNTER — Inpatient Hospital Stay (HOSPITAL_COMMUNITY): Payer: Medicare Other

## 2015-06-01 ENCOUNTER — Encounter (HOSPITAL_COMMUNITY): Payer: Self-pay | Admitting: Radiology

## 2015-06-01 DIAGNOSIS — R945 Abnormal results of liver function studies: Secondary | ICD-10-CM | POA: Insufficient documentation

## 2015-06-01 DIAGNOSIS — E1100 Type 2 diabetes mellitus with hyperosmolarity without nonketotic hyperglycemic-hyperosmolar coma (NKHHC): Secondary | ICD-10-CM

## 2015-06-01 DIAGNOSIS — R7989 Other specified abnormal findings of blood chemistry: Secondary | ICD-10-CM

## 2015-06-01 LAB — GLUCOSE, CAPILLARY
GLUCOSE-CAPILLARY: 152 mg/dL — AB (ref 65–99)
Glucose-Capillary: 136 mg/dL — ABNORMAL HIGH (ref 65–99)
Glucose-Capillary: 130 mg/dL — ABNORMAL HIGH (ref 65–99)
Glucose-Capillary: 109 mg/dL — ABNORMAL HIGH (ref 65–99)
Glucose-Capillary: 115 mg/dL — ABNORMAL HIGH (ref 65–99)

## 2015-06-01 LAB — HEPATITIS PANEL, ACUTE
HCV AB: 0.1 {s_co_ratio} (ref 0.0–0.9)
HEP A IGM: NEGATIVE
HEP B S AG: NEGATIVE
Hep B C IgM: NEGATIVE

## 2015-06-01 LAB — COMPREHENSIVE METABOLIC PANEL
ALT: 197 U/L — ABNORMAL HIGH (ref 14–54)
ANION GAP: 8 (ref 5–15)
AST: 103 U/L — AB (ref 15–41)
Albumin: 3.1 g/dL — ABNORMAL LOW (ref 3.5–5.0)
Alkaline Phosphatase: 74 U/L (ref 38–126)
BILIRUBIN TOTAL: 1.6 mg/dL — AB (ref 0.3–1.2)
BUN: 9 mg/dL (ref 6–20)
CHLORIDE: 102 mmol/L (ref 101–111)
CO2: 24 mmol/L (ref 22–32)
Calcium: 9 mg/dL (ref 8.9–10.3)
Creatinine, Ser: 0.78 mg/dL (ref 0.44–1.00)
Glucose, Bld: 151 mg/dL — ABNORMAL HIGH (ref 65–99)
POTASSIUM: 3.8 mmol/L (ref 3.5–5.1)
Sodium: 134 mmol/L — ABNORMAL LOW (ref 135–145)
Total Protein: 5.9 g/dL — ABNORMAL LOW (ref 6.5–8.1)

## 2015-06-01 LAB — LIPASE, BLOOD: Lipase: 23 U/L (ref 11–51)

## 2015-06-01 LAB — EPSTEIN-BARR VIRUS VCA, IGM: EBV VCA IgM: <36 U/mL (ref 0.0–35.9)

## 2015-06-01 LAB — CMV IGM

## 2015-06-01 LAB — EPSTEIN-BARR VIRUS VCA, IGG: EBV VCA IgG: <18 U/mL (ref 0.0–17.9)

## 2015-06-01 MED ORDER — GADOBENATE DIMEGLUMINE 529 MG/ML IV SOLN
14.0000 mL | Freq: Once | INTRAVENOUS | Status: AC | PRN
  Administered 2015-06-01: 14 mL via INTRAVENOUS

## 2015-06-01 MED ORDER — SODIUM CHLORIDE 0.9 % IV SOLN
INTRAVENOUS | Status: DC
  Administered 2015-06-01 – 2015-06-03 (×4): via INTRAVENOUS

## 2015-06-01 NOTE — Evaluation (Addendum)
Physical Therapy Evaluation Patient Details Name: Brittany Boyle MRN: [id number] DOB: 27-Apr-1921 Today's Date: 06/01/2015   History of Present Illness  82 y.o. female with h/o DM, HTN, seizures. Patient lives at home. Patient presents to ED with c/o N/V and epigastric abdominal pain. Symptoms onset this morning, unable to keep any food down. Patient went to UC (results in care everywhere) where her LFTs were noted to be elevated. She was sent to the ED where her LFTs were noted to be even more elevated.  Clinical Impression  Pt admitted with above diagnosis. Pt currently with functional limitations due to the deficits listed below (see PT Problem List). Pt functioning near baseline but demo's higher level balance deficits.  Pt will benefit from skilled PT to increase their independence and safety with mobility to allow discharge to the venue listed below.       Follow Up Recommendations Outpatient PT, 24/7 supervision (would benefit for higher level balance but pt declined services and reports "ill think about it")    Equipment Recommendations  None recommended by PT    Recommendations for Other Services       Precautions / Restrictions Precautions Precautions: Fall Restrictions Weight Bearing Restrictions: No      Mobility  Bed Mobility               General bed mobility comments: pt up in chair upon PT arrival  Transfers Overall transfer level: Needs assistance Equipment used: None Transfers: Sit to/from Stand Sit to Stand: Supervision         General transfer comment: pt with good technique, pushed up from arm rests  Ambulation/Gait Ambulation/Gait assistance: Min guard Ambulation Distance (Feet): 200 Feet Assistive device: None Gait Pattern/deviations: WFL(Within Functional Limits);Decreased stride length (for age) Gait velocity: wfl for age   General Gait Details: no episodes of LOB or instability, just mildly slow and guarded, typical for  age  80            Wheelchair Mobility    Modified Rankin (Stroke Patients Only)       Balance Overall balance assessment: No apparent balance deficits (not formally assessed)                                           Pertinent Vitals/Pain Pain Assessment: No/denies pain    Home Living Family/patient expects to be discharged to:: Private residence Living Arrangements: Spouse/significant other (who's 49 yo) Available Help at Discharge: Family;Available 24 hours/day Type of Home: House Home Access: Stairs to enter Entrance Stairs-Rails: None Entrance Stairs-Number of Steps: 2 Home Layout: One level Home Equipment: Environmental consultant - 2 wheels      Prior Function Level of Independence: Independent         Comments: spouse helps out with cooking and cleaning, spouse drives     Hand Dominance   Dominant Hand: Right    Extremity/Trunk Assessment   Upper Extremity Assessment: Overall WFL for tasks assessed           Lower Extremity Assessment: Generalized weakness      Cervical / Trunk Assessment: Normal  Communication   Communication: No difficulties  Cognition Arousal/Alertness: Awake/alert Behavior During Therapy: WFL for tasks assessed/performed Overall Cognitive Status: Within Functional Limits for tasks assessed                      General Comments  Exercises        Assessment/Plan    PT Assessment Patient needs continued PT services  PT Diagnosis Generalized weakness   PT Problem List Decreased strength;Decreased activity tolerance;Decreased balance;Decreased mobility  PT Treatment Interventions     PT Goals (Current goals can be found in the Care Plan section) Acute Rehab PT Goals Patient Stated Goal: home PT Goal Formulation: With patient Time For Goal Achievement: 06/08/15 Potential to Achieve Goals: Good Additional Goals Additional Goal #1: Pt to score >19 on DGI to indicate minimal falls risk.     Frequency Min 2X/week   Barriers to discharge        Co-evaluation               End of Session Equipment Utilized During Treatment: Gait belt Activity Tolerance: Patient tolerated treatment well Patient left: in chair;with call bell/phone within reach           Time: 0822-0838 PT Time Calculation (min) (ACUTE ONLY): 16 min   Charges:   PT Evaluation $PT Eval Low Complexity: 1 Procedure     PT G CodesKingsley Callander 06/01/2015, 9:17 AM   Kittie Plater, PT, DPT Pager #: [phone] Office #: [phone]

## 2015-06-01 NOTE — Progress Notes (Signed)
[facility] PROGRESS NOTE  Kimberlyanne Choudhury [id number] DOB: 1921-07-14 DOA: 05/30/2015 PCP: Verdell Carmine., MD  Summary 82 y.o. female with h/o DM, HTN, seizures. Patient lives at home. Patient presents to ED with c/o N/V and epigastric abdominal pain. Symptoms onset this morning, unable to keep any food down. Patient went to UC (results in care everywhere) where her LFTs were noted to be elevated. She was sent to the ED where her LFTs were noted to be even more elevated.   Assessment/Plan:      Acute hepatitis:improving ,  ? Viral? CAT scan, there is mild ductal dilatation, possibly normal for age. Patient however reports that she thinks she may have had gallbladder problems, and there was consideration for cholecystectomy when she was admitted to [facility] a few months ago. Alkaline phosphatase is normal,  right upper quadrant ultrasound  Showed Gallbladder sludge without stones. Negative sonographic Murphy's sign.Mild intra and extra hepatic biliary ductal dilatation. Steatosis.MRCP ordered and pending   Patient was admitted at [facility] in December 2016 for acute gallstone pancreatitis.  . Hold Plavix for now.NPO pending MRCP .  Patient may need GI/general surgery consultation for further evaluation. Bilirubin is trending down .  follow CMP   History of coronary artery disease on aspirin and Plavix, continue patient on telemetry, cycle cardiac enzymes     DM2 (diabetes mellitus, type 2) ([place]): Continue sliding scale for now    Seizure disorder (HCC)-continue Klonopin, Keppra,    Essential hypertension controlled    Code Status:  full Family Communication:  Daughter at bedside Disposition Plan:  Home pending results of MRCP   Consultants:    Procedures:     Antibiotics:    HPI/Subjective: Tolerating clears. No abdominal pain. No vomiting or diarrhea. Cannot recall if any medications were changed recently. Thinks somebody may  have mentioned cholecystectomy during one of her previous hospitalizations, but cannot recall the details.   Objective: Filed Vitals:   05/31/15 2142 06/01/15 0521  BP: 119/37 127/58  Pulse: 73 69  Temp: 98.6 F (37 C) 98.1 F (36.7 C)  Resp: 19 20    Intake/Output Summary (Last 24 hours) at 06/01/15 0808 Last data filed at 05/31/15 1800  Gross per 24 hour  Intake    575 ml  Output      4 ml  Net    571 ml   Filed Weights   06/01/15 0751  Weight: 69 kg (152 lb 1.9 oz)    Exam:   General:  Elderly. Cooperative. Slow to respond and difficult historian. Oriented however  HEENT: Moist mucous membranes. No thrush. Sclera non-icteric  Cardiovascular: Regular rate rhythm without murmurs gallops rubs  Respiratory: Clear to auscultation bilaterally without wheezes rhonchi or rales  Abdomen: Soft nontender nondistended no hepatosplenomegaly  Ext: No clubbing cyanosis or edema  Skin: No definite jaundice  Basic Metabolic Panel:  Recent Labs Lab 05/30/15 2031 05/31/15 0209  NA 130* 135  K 4.3 4.2  CL 96* 101  CO2 22 24  GLUCOSE 209* 161*  BUN 10 7  CREATININE 0.82 0.75  CALCIUM 9.8 9.6   Liver Function Tests:  Recent Labs Lab 05/30/15 2031 05/31/15 0209  AST 799* 498*  ALT 435* 421*  ALKPHOS 91 79  BILITOT 3.5* 4.1*  PROT 7.0 6.6  ALBUMIN 3.9 3.4*    Recent Labs Lab 05/30/15 2031  LIPASE 29   No results for input(s): AMMONIA in the last 168 hours. CBC:  Recent Labs Lab  05/30/15 2031  WBC 11.5*  NEUTROABS 10.0*  HGB 13.1  HCT 37.9  MCV 89.0  PLT 210   Cardiac Enzymes: No results for input(s): CKTOTAL, CKMB, CKMBINDEX, TROPONINI in the last 168 hours. BNP (last 3 results) No results for input(s): BNP in the last 8760 hours.  ProBNP (last 3 results) No results for input(s): PROBNP in the last 8760 hours.  CBG:  Recent Labs Lab 05/31/15 0728 05/31/15 1214 05/31/15 1641 05/31/15 2120 06/01/15 0734  GLUCAP 116* 127* 179* 188*  136*    No results found for this or any previous visit (from the past 240 hour(s)).   Studies: Ct Abdomen Pelvis W Contrast  05/30/2015  CLINICAL DATA:  Epigastric abdominal pain and nausea and vomiting today. Elevated liver enzymes at a walk in clinic. EXAM: CT ABDOMEN AND PELVIS WITH CONTRAST TECHNIQUE: Multidetector CT imaging of the abdomen and pelvis was performed using the standard protocol following bolus administration of intravenous contrast. CONTRAST:  1 ISOVUE-300 IOPAMIDOL (ISOVUE-300) INJECTION 61% COMPARISON:  None. FINDINGS: Tiny subpleural nodules and calcified granulomas demonstrated in the lung bases, likely postinflammatory. Diffuse fatty infiltration of the liver. Mild bile duct dilatation is likely normal for patient age. Gallbladder, pancreas, spleen, kidneys, abdominal aorta, inferior vena cava, and retroperitoneal lymph nodes are unremarkable. Large mostly fatty density mass in the right adrenal gland measuring 4.3 cm diameter. 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 The stomach, small bowel, and colon are mostly decompressed. Scattered stool in the colon. There is a vague infiltration in the root of the mesentery. This is nonspecific but could indicate inflammatory process such as mesenteritis. No free air or free fluid in the abdomen. Pelvis: The appendix is normal. Bladder wall is not thickened. Bladder is distended, possibly physiologic or possibly indicating outlet obstruction. Uterus and ovaries are not enlarged. Increased density in the uterus suggesting fibroid. No other pelvic mass or lymphadenopathy. No free or loculated pelvic fluid collections. Degenerative changes in the spine and hips. Compression of the superior endplate of L3 with approximately 50% loss of height. Suggestion of mild cortical irregularity. This may represent acute compression. No destructive bone lesions. IMPRESSION: Diffuse fatty infiltration of the liver. Right adrenal gland mass with fat likely  representing myelo lipoma. Vague infiltration in the root of the mesenteric possibly indicating mesenteritis. Nonspecific distention of the bladder. Compression of the superior endplate of L3, possibly acute compression. Electronically Signed   By: Lucienne Capers M.D.   On: 05/30/2015 22:51   US Abdomen Limited Ruq  05/31/2015  CLINICAL DATA:  Elevated liver function test. EXAM: US ABDOMEN LIMITED - RIGHT UPPER QUADRANT COMPARISON:  CT 05/30/2015. FINDINGS: Gallbladder: Sludge in the gallbladder. Wall thickness mildly prominent 2.5 mm. Negative sonographic Murphy's sign. No layering stones. Common bile duct: Diameter: Mildly increased, 8 mm diameter. Liver: Increased echogenicity.  Slight intrahepatic ductal dilatation. Ancillary finding: As noted from CT there is a large RIGHT adrenal mass, favored to represent myelolipoma. IMPRESSION: Gallbladder sludge without stones. Negative sonographic Murphy's sign. Mild intra and extra hepatic biliary ductal dilatation.  Steatosis. Large RIGHT adrenal mass. Abnormal LFTs. Electronically Signed   By: Staci Righter M.D.   On: 05/31/2015 15:12    Scheduled Meds: . aspirin EC  81 mg Oral Daily  . enoxaparin (LOVENOX) injection  40 mg Subcutaneous Q24H  . gabapentin  100 mg Oral QHS  . insulin aspart  0-9 Units Subcutaneous TID WC  . levETIRAcetam  500 mg Oral BID  . losartan  100 mg Oral  Daily  . metoprolol succinate  50 mg Oral Daily  . multivitamin with minerals  1 tablet Oral Q lunch  . pantoprazole  40 mg Oral Daily  . senna-docusate  1 tablet Oral QHS  . sodium chloride  1 g Oral QODAY   Continuous Infusions: . sodium chloride      Time spent: 35 minutes  [facility] [email or web address], password [facility] 06/01/2015, 8:08 AM  LOS: 2 days

## 2015-06-02 ENCOUNTER — Encounter (HOSPITAL_COMMUNITY): Payer: Self-pay | Admitting: Cardiovascular Disease

## 2015-06-02 DIAGNOSIS — Z01810 Encounter for preprocedural cardiovascular examination: Secondary | ICD-10-CM

## 2015-06-02 DIAGNOSIS — I2510 Atherosclerotic heart disease of native coronary artery without angina pectoris: Secondary | ICD-10-CM

## 2015-06-02 DIAGNOSIS — I10 Essential (primary) hypertension: Secondary | ICD-10-CM

## 2015-06-02 DIAGNOSIS — K810 Acute cholecystitis: Secondary | ICD-10-CM

## 2015-06-02 LAB — COMPREHENSIVE METABOLIC PANEL
ALBUMIN: 3 g/dL — AB (ref 3.5–5.0)
ALK PHOS: 61 U/L (ref 38–126)
ALT: 131 U/L — AB (ref 14–54)
AST: 45 U/L — AB (ref 15–41)
Anion gap: 8 (ref 5–15)
BILIRUBIN TOTAL: 0.9 mg/dL (ref 0.3–1.2)
BUN: 13 mg/dL (ref 6–20)
CALCIUM: 8.9 mg/dL (ref 8.9–10.3)
CO2: 23 mmol/L (ref 22–32)
CREATININE: 0.74 mg/dL (ref 0.44–1.00)
Chloride: 106 mmol/L (ref 101–111)
GFR calc Af Amer: >60 mL/min (ref >60)
GFR calc non Af Amer: >60 mL/min (ref >60)
GLUCOSE: 121 mg/dL — AB (ref 65–99)
Potassium: 4.1 mmol/L (ref 3.5–5.1)
Sodium: 137 mmol/L (ref 135–145)
TOTAL PROTEIN: 5.8 g/dL — AB (ref 6.5–8.1)

## 2015-06-02 LAB — CBC
HCT: 34.5 % — ABNORMAL LOW (ref 36.0–46.0)
Hemoglobin: 11.5 g/dL — ABNORMAL LOW (ref 12.0–15.0)
MCH: 30.3 pg (ref 26.0–34.0)
MCHC: 33.3 g/dL (ref 30.0–36.0)
MCV: 90.8 fL (ref 78.0–100.0)
Platelets: 191 10*3/uL (ref 150–400)
RBC: 3.8 MIL/uL — ABNORMAL LOW (ref 3.87–5.11)
RDW: 13.3 % (ref 11.5–15.5)
WBC: 4.5 10*3/uL (ref 4.0–10.5)

## 2015-06-02 LAB — GLUCOSE, CAPILLARY
GLUCOSE-CAPILLARY: 134 mg/dL — AB (ref 65–99)
GLUCOSE-CAPILLARY: 201 mg/dL — AB (ref 65–99)
Glucose-Capillary: 166 mg/dL — ABNORMAL HIGH (ref 65–99)
Glucose-Capillary: 176 mg/dL — ABNORMAL HIGH (ref 65–99)

## 2015-06-02 MED ORDER — CIPROFLOXACIN IN D5W 400 MG/200ML IV SOLN: 400.0000 mg | INTRAVENOUS | Status: DC

## 2015-06-02 NOTE — Care Management Important Message (Signed)
Important Message  Patient Details  Name: Brittany Boyle MRN: [id number] Date of Birth: 07/29/1921   Medicare Important Message Given:  Yes    [name] 06/02/2015, 12:53 PM

## 2015-06-02 NOTE — Care Management Note (Signed)
Case Management Note  Patient Details  Name: Brittany Boyle MRN: [id number] Date of Birth: 1922/01/06  Subjective/Objective:         CM following for progression and d/c planning.           Action/Plan: 06/02/2015 Noted consult for outpatient PT, met with pt and husband who informed this CM that the plan is for surgery in the next few days, so soon as she has been off of her Plavix for a few more days. As this is unclear in the notes , this CM contacted Dr Allyson Sabal to discuss. Per Dr Allyson Sabal this is a decision to be made by the surgeon. Currently, CM will continue to follow for plan and attempt to justify inpatient stay while awaiting surgical decision.   Expected Discharge Date:                  Expected Discharge Plan:  [place]  In-House Referral:  NA  Discharge planning Services  CM Consult  Post Acute Care Choice:    Choice offered to:     DME Arranged:    DME Agency:     HH Arranged:    HH Agency:     Status of Service:  In process, will continue to follow  Medicare Important Message Given:  Yes Date Medicare IM Given:    Medicare IM give by:    Date Additional Medicare IM Given:    Additional Medicare Important Message give by:     If discussed at [place] of Stay Meetings, dates discussed:    Additional Comments:  Adron Bene, RN 06/02/2015, 2:35 PM

## 2015-06-02 NOTE — Progress Notes (Addendum)
[facility] PROGRESS NOTE  Haviland Keagy [id number] DOB: March 27, 1921 DOA: 05/30/2015 PCP: Verdell Carmine., MD  Summary 82 y.o. female with h/o DM, HTN, seizures. Patient lives at home. Patient presents to ED with c/o N/V and epigastric abdominal pain. Symptoms onset this morning, unable to keep any food down. Patient went to UC (results in care everywhere) where her LFTs were noted to be elevated. She was sent to the ED where her LFTs were noted to be even more elevated.  02/27/15  [facility]- 82 y.o. female with past medical history of hypertension, diabetes mellitus presented to the emergency department with complains of some onset of abdominal pain associated with nausea and vomiting. In the ED, patient underwent ultrasound of the abdomen which showed sonographic evidence of acute cholecystitis. She was then admitted to the hospital for conservative treatment with IV fluids, antiemetics and IV antibiotics. Surgery was also consulted who recommended conservative treatment at that time. CT scan of the abdomen and pelvis was subsequently performed which which showed peripancreatic and retroperitoneal inflammatory changes suggestive of acute pancreatitis. She was then conservatively treated for acute pancreatitis. Patient symptomatically improved and did not have any pain prior to discharge. She had tolerated her diet. Exact cause for pancreatitis was unknown. Her lipid profile was within normal limits. LFTS were elevated on presentation, which trended down prior to discharge. Her initial lipase level was 1795 and it trended down to 164   Assessment/Plan:     Acute hepatitis: With mild ductal dilatation, likely secondary to passage of a gallstone. Recent admission at [facility] for gallstone pancreatitis/acute cholecystitis. Discharge summary as above from [facility] a few months ago.  Workup here right upper quadrant ultrasound  Showed Gallbladder sludge without  stones. Negative sonographic Murphy's sign.Mild intra and extra hepatic biliary ductal dilatation. Steatosis.MRCP shows probable mesenteric adenitis/panniculitis, recent passage of stone, possible ampullary lesion Gen. surgery consulted, patient will need cardiology consultation for  preoperative clearance for cholecystectomy   History of coronary artery disease on aspirin and Plavix, continue patient on telemetry, cardiac enzymes negative 1 Plavix on hold since 3/27, last dose 3/26     DM2 (diabetes mellitus, type 2) ([place]): Continue sliding scale for now    Seizure disorder (HCC)-continue Klonopin, Keppra,    Essential hypertension controlled    Code Status:  full Family Communication:  Daughter at bedside Disposition Plan:  General surgery consultation   Consultants:  General surgery  Procedures:     Antibiotics:  None    HPI/Subjective: Denies any epigastric pain  Objective: Filed Vitals:   06/02/15 0624 06/02/15 0846  BP: 159/55 128/51  Pulse: 86 63  Temp: 98.5 F (36.9 C) 97.6 F (36.4 C)  Resp: 18     Intake/Output Summary (Last 24 hours) at 06/02/15 0901 Last data filed at 06/02/15 0600  Gross per 24 hour  Intake 2363.33 ml  Output      3 ml  Net 2360.33 ml   Filed Weights   06/01/15 0751 06/01/15 2253  Weight: 69 kg (152 lb 1.9 oz) 67.5 kg (148 lb 13 oz)    Exam:   General:  Elderly. Cooperative. Slow to respond and difficult historian. Oriented however  HEENT: Moist mucous membranes. No thrush. Sclera non-icteric  Cardiovascular: Regular rate rhythm without murmurs gallops rubs  Respiratory: Clear to auscultation bilaterally without wheezes rhonchi or rales  Abdomen: Soft nontender nondistended no hepatosplenomegaly  Ext: No clubbing cyanosis or edema  Skin: No definite jaundice  Basic Metabolic  Panel:  Recent Labs Lab 05/30/15 2031 05/31/15 0209 21-Jun-2015 0746 06/02/15 0548  NA 130* 135 134* 137  K 4.3 4.2 3.8 4.1  CL  96* 101 102 106  CO2 22 24 24 23   GLUCOSE 209* 161* 151* 121*  BUN 10 7 9 13   CREATININE 0.82 0.75 0.78 0.74  CALCIUM 9.8 9.6 9.0 8.9   Liver Function Tests:  Recent Labs Lab 05/30/15 2031 05/31/15 0209 2015-06-21 0746 06/02/15 0548  AST 799* 498* 103* 45*  ALT 435* 421* 197* 131*  ALKPHOS 91 79 74 61  BILITOT 3.5* 4.1* 1.6* 0.9  PROT 7.0 6.6 5.9* 5.8*  ALBUMIN 3.9 3.4* 3.1* 3.0*    Recent Labs Lab 05/30/15 2031 June 21, 2015 0746  LIPASE 29 23   No results for input(s): AMMONIA in the last 168 hours. CBC:  Recent Labs Lab 05/30/15 2031 06/02/15 0548  WBC 11.5* 4.5  NEUTROABS 10.0*  --   HGB 13.1 11.5*  HCT 37.9 34.5*  MCV 89.0 90.8  PLT 210 191   Cardiac Enzymes: No results for input(s): CKTOTAL, CKMB, CKMBINDEX, TROPONINI in the last 168 hours. BNP (last 3 results) No results for input(s): BNP in the last 8760 hours.  ProBNP (last 3 results) No results for input(s): PROBNP in the last 8760 hours.  CBG:  Recent Labs Lab Jun 21, 2015 0734 2015-06-21 1136 06/21/15 1708 June 21, 2015 2248 06/02/15 0820  GLUCAP 136* 130* 109* 115* 134*    Recent Results (from the past 240 hour(s))  Culture, blood (Routine X 2) w Reflex to ID Panel     Status: None (Preliminary result)   Collection Time: June 21, 2015 12:15 PM  Result Value Ref Range Status   Specimen Description BLOOD RIGHT ANTECUBITAL  Final   Special Requests BOTTLES DRAWN AEROBIC AND ANAEROBIC 5CC  Final   Culture PENDING  Incomplete   Report Status PENDING  Incomplete  Culture, blood (Routine X 2) w Reflex to ID Panel     Status: None (Preliminary result)   Collection Time: 06/21/15 12:25 PM  Result Value Ref Range Status   Specimen Description BLOOD RIGHT ANTECUBITAL  Final   Special Requests BOTTLES DRAWN AEROBIC AND ANAEROBIC 5CC  Final   Culture PENDING  Incomplete   Report Status PENDING  Incomplete     Studies: Mr 3d Recon At Scanner  06-21-2015  CLINICAL DATA:  Elevated liver function tests.  Diabetes. Hypertension. Nausea vomiting and epigastric abdominal pain. EXAM: MRI ABDOMEN WITHOUT AND WITH CONTRAST (INCLUDING MRCP) TECHNIQUE: Multiplanar multisequence MR imaging of the abdomen was performed both before and after the administration of intravenous contrast. Heavily T2-weighted images of the biliary and pancreatic ducts were obtained, and three-dimensional MRCP images were rendered by post processing. CONTRAST:  58mL MULTIHANCE GADOBENATE DIMEGLUMINE 529 MG/ML IV SOLN COMPARISON:  05/31/2015 abdominal ultrasound.  05/30/2015 CT. FINDINGS: Lower chest: Mild cardiomegaly, without pericardial or pleural effusion. Hepatobiliary: Normal liver. Intrahepatic ducts upper normal for patient age. Normal gallbladder. Low cystic duct insertion. Common duct measures maximally 11 mm, including on image 40/series 6. Upper normal in this age group 9-10 mm. At and just above the ampulla, there is incomplete distension, including image 25/series 6. No well-defined common duct stone. On post-contrast images, there is hyperenhancement in this area, including on image 71/ series 11304. Pancreas: Normal, without mass or ductal dilatation. Spleen: Normal in size, without focal abnormality. Adrenals/Urinary Tract: Normal left adrenal gland. A right adrenal myelolipoma measures 4.2 x 3.1 cm. Normal kidneys, without hydronephrosis. Stomach/Bowel: Normal stomach and abdominal bowel  loops. There are small nodes in the jejunal mesenteric fat with increased T1 signal within. Example image 70/ series 13004. Vascular/Lymphatic: Advanced aortic and branch vessel atherosclerosis. No retroperitoneal or retrocrural adenopathy. Other: No ascites. Musculoskeletal: Hyperenhancement at the site of a subacute L3 compression deformity. IMPRESSION: 1. Mild common duct dilatation for age. Area of underdistention at and just above hte ampulla corresponding to post-contrast enhancement. Differential considerations include recent stone passage or  a subtle ampullary lesion. Consider surveillance of liver function tests. If the bilirubin is persistently elevated, ERCP should be considered. 2. Right adrenal myelolipoma. 3. Probable mesenteric adenitis/panniculitis, of indeterminate acuity and significance. 4. L3 compression deformity, likely subacute. Electronically Signed   By: Abigail Miyamoto M.D.   On: 06/01/2015 17:27   Mr Abd W/wo Cm/mrcp  06/01/2015  CLINICAL DATA:  Elevated liver function tests. Diabetes. Hypertension. Nausea vomiting and epigastric abdominal pain. EXAM: MRI ABDOMEN WITHOUT AND WITH CONTRAST (INCLUDING MRCP) TECHNIQUE: Multiplanar multisequence MR imaging of the abdomen was performed both before and after the administration of intravenous contrast. Heavily T2-weighted images of the biliary and pancreatic ducts were obtained, and three-dimensional MRCP images were rendered by post processing. CONTRAST:  63mL MULTIHANCE GADOBENATE DIMEGLUMINE 529 MG/ML IV SOLN COMPARISON:  05/31/2015 abdominal ultrasound.  05/30/2015 CT. FINDINGS: Lower chest: Mild cardiomegaly, without pericardial or pleural effusion. Hepatobiliary: Normal liver. Intrahepatic ducts upper normal for patient age. Normal gallbladder. Low cystic duct insertion. Common duct measures maximally 11 mm, including on image 40/series 6. Upper normal in this age group 9-10 mm. At and just above the ampulla, there is incomplete distension, including image 25/series 6. No well-defined common duct stone. On post-contrast images, there is hyperenhancement in this area, including on image 71/ series 11304. Pancreas: Normal, without mass or ductal dilatation. Spleen: Normal in size, without focal abnormality. Adrenals/Urinary Tract: Normal left adrenal gland. A right adrenal myelolipoma measures 4.2 x 3.1 cm. Normal kidneys, without hydronephrosis. Stomach/Bowel: Normal stomach and abdominal bowel loops. There are small nodes in the jejunal mesenteric fat with increased T1 signal within.  Example image 70/ series 13004. Vascular/Lymphatic: Advanced aortic and branch vessel atherosclerosis. No retroperitoneal or retrocrural adenopathy. Other: No ascites. Musculoskeletal: Hyperenhancement at the site of a subacute L3 compression deformity. IMPRESSION: 1. Mild common duct dilatation for age. Area of underdistention at and just above hte ampulla corresponding to post-contrast enhancement. Differential considerations include recent stone passage or a subtle ampullary lesion. Consider surveillance of liver function tests. If the bilirubin is persistently elevated, ERCP should be considered. 2. Right adrenal myelolipoma. 3. Probable mesenteric adenitis/panniculitis, of indeterminate acuity and significance. 4. L3 compression deformity, likely subacute. Electronically Signed   By: Abigail Miyamoto M.D.   On: 06/01/2015 17:27   US Abdomen Limited Ruq  05/31/2015  CLINICAL DATA:  Elevated liver function test. EXAM: US ABDOMEN LIMITED - RIGHT UPPER QUADRANT COMPARISON:  CT 05/30/2015. FINDINGS: Gallbladder: Sludge in the gallbladder. Wall thickness mildly prominent 2.5 mm. Negative sonographic Murphy's sign. No layering stones. Common bile duct: Diameter: Mildly increased, 8 mm diameter. Liver: Increased echogenicity.  Slight intrahepatic ductal dilatation. Ancillary finding: As noted from CT there is a large RIGHT adrenal mass, favored to represent myelolipoma. IMPRESSION: Gallbladder sludge without stones. Negative sonographic Murphy's sign. Mild intra and extra hepatic biliary ductal dilatation.  Steatosis. Large RIGHT adrenal mass. Abnormal LFTs. Electronically Signed   By: Staci Righter M.D.   On: 05/31/2015 15:12    Scheduled Meds: . aspirin EC  81 mg Oral Daily  . enoxaparin (  LOVENOX) injection  40 mg Subcutaneous Q24H  . gabapentin  100 mg Oral QHS  . insulin aspart  0-9 Units Subcutaneous TID WC  . levETIRAcetam  500 mg Oral BID  . losartan  100 mg Oral Daily  . metoprolol succinate  50 mg  Oral Daily  . multivitamin with minerals  1 tablet Oral Q lunch  . pantoprazole  40 mg Oral Daily  . senna-docusate  1 tablet Oral QHS  . sodium chloride  1 g Oral QODAY   Continuous Infusions: . sodium chloride 100 mL/hr at 06/02/15 0200    Time spent: 35 minutes  [facility]  [facility] [email or web address], password [facility] 06/02/2015, 9:01 AM  LOS: 3 days

## 2015-06-02 NOTE — Consult Note (Signed)
Reason for Consult:  cholelithiasis Referring Physician: Dr. Reyne Dumas PCP:  Verdell Carmine., MD   Brittany Boyle is an 82 y.o. female.  HPI: Pt is a 82 y/o who presented to her with weakness, some nausea and vomiting, epigastric pain day prior.  By the time she got to the ED here her pain had resolved.  LFT were elevated in Dr. Verdie Drown office.  She was admitted to  Medicine on 05/30/15.   Work up in the ED showed she had a low grade temp 99.  Bilirubin was 3.5, AST 799, ALT 435.  Bilirubin went up to 4.1 on 3/26, but all her LFT's are returning to normal.  WBC went from 11.5. To 4.5 today.  CT scan on admit 05/30/15,  Showed:  Right adrenal gland mass with fat likely representing myelo lipoma. Vague infiltration in the root of the mesenteric possibly indicating mesenteritis. Nonspecific distention of the bladder. Compression of the superior endplate of L3, possibly acute compression.  She has been seen at [facility] and [facility] for other issues and is now here with  Improving LFT's.   MRI on 06/01/15 shows: Mild common duct dilatation for age. Area of underdistention at and just above hte ampulla corresponding to post-contrast enhancement. Differential considerations include recent stone passage or a subtle ampullary lesion. Consider surveillance of liver function tests. If the bilirubin is persistently elevated, ERCP should be considered.  2. Right adrenal myelolipoma.  3. Probable mesenteric adenitis/panniculitis, of indeterminate acuity and significance. 4. L3 compression deformity, likely subacute.   Recent hospitalizations at [facility] and [facility]:  12/23-27/2016 Pancreatitis/acute cholecystitis at [facility]  1/17-20/20117  At [facility] with suspected seizure with work up finding a meningioma, vasogenic cerebral edema and hyponatremia. Neurosurgeon was consulted for meningioma with cerebral edema. Neurosurgeon was of the opinion that meningioma was chronic and vasogenic edema was  minimal and therefore were not responsible for her seizures. Hyponatremia was treated with IV fluids and her sodium improved from 126 to 134 prior to discharge. There were no seizure episodes in the hospital. She was feeling better and she wanted to be discharged home. She was deemed stable for discharge to home with home PT.   We are ask to see.  Past Medical History  Diagnosis Date  . Hypertension   . Diabetes mellitus without complication ([place])   . Seizures [facility])     Past Surgical History  Procedure Laterality Date  . Coronary stent placement      06/01/15 pt stated that she has a stent     History reviewed. No pertinent family history.  Social History:  reports that she has never smoked. She has never used smokeless tobacco. She reports that she does not drink alcohol or use illicit drugs.  Allergies:  Allergies  Allergen Reactions  . Penicillins Rash    Has patient had a PCN reaction causing immediate rash, facial/tongue/throat swelling, SOB or lightheadedness with hypotension: Yes Has patient had a PCN reaction causing severe rash involving mucus membranes or skin necrosis: No Has patient had a PCN reaction that required hospitalization No Has patient had a PCN reaction occurring within the last 10 years: No If all of the above answers are "NO", then may proceed with Cephalosporin use.  Tobacco:  Second hand, and none for 40+ years ETOH:  None DRugs:  None   Medications:  Prior to Admission:  Prescriptions prior to admission  Medication Sig Dispense Refill Last Dose  . aspirin EC 81 MG  tablet Take 81 mg by mouth daily.   05/30/2015 at Unknown time  . clonazePAM (KLONOPIN) 0.5 MG tablet Take 0.5 mg by mouth 2 (two) times daily as needed for anxiety.   3 weeks ago  . clopidogrel (PLAVIX) 75 MG tablet Take 75 mg by mouth daily.   05/30/2015 at Unknown time  . cyclobenzaprine (FLEXERIL) 5 MG tablet Take 5 mg by mouth 3 (three) times daily as needed for muscle spasms.   2  weeks ago  . gabapentin (NEURONTIN) 100 MG capsule Take 100 mg by mouth at bedtime.   05/29/2015 at Unknown time  . levETIRAcetam (KEPPRA) 500 MG tablet Take 500 mg by mouth 2 (two) times daily.   05/30/2015 at 830  . losartan (COZAAR) 100 MG tablet Take 100 mg by mouth daily.   05/30/2015 at Unknown time  . metFORMIN (GLUCOPHAGE) 500 MG tablet Take 500 mg by mouth 2 (two) times daily with a meal.   05/30/2015 at am  . metoprolol succinate (TOPROL-XL) 50 MG 24 hr tablet Take 50 mg by mouth daily. Take with or immediately following a meal.   05/30/2015 at 830  . Multiple Vitamin (MULTIVITAMIN WITH MINERALS) TABS tablet Take 1 tablet by mouth daily with lunch.   05/29/2015 at Unknown time  . omeprazole (PRILOSEC) 20 MG capsule Take 20 mg by mouth daily.   05/30/2015 at Unknown time  . ondansetron (ZOFRAN) 4 MG tablet Take 4 mg by mouth every 8 (eight) hours as needed for nausea or vomiting.   05/30/2015 at 1000  . OVER THE COUNTER MEDICATION Place 1 drop into both eyes daily as needed (dry eyes). OTC lubricating eye drop   couple days ago  . senna-docusate (SENOKOT-S) 8.6-50 MG tablet Take 1 tablet by mouth at bedtime.   05/29/2015 at Unknown time  . sodium chloride 1 g tablet Take 1 g by mouth every other day.   05/30/2015 at Unknown time  . spironolactone (ALDACTONE) 25 MG tablet Take 25 mg by mouth daily.   05/30/2015 at Unknown time   Scheduled: . aspirin EC  81 mg Oral Daily  . enoxaparin (LOVENOX) injection  40 mg Subcutaneous Q24H  . gabapentin  100 mg Oral QHS  . insulin aspart  0-9 Units Subcutaneous TID WC  . levETIRAcetam  500 mg Oral BID  . losartan  100 mg Oral Daily  . metoprolol succinate  50 mg Oral Daily  . multivitamin with minerals  1 tablet Oral Q lunch  . pantoprazole  40 mg Oral Daily  . senna-docusate  1 tablet Oral QHS  . sodium chloride  1 g Oral QODAY   Continuous: . sodium chloride 100 mL/hr at 06/02/15 0200   XBD:[id number], cyclobenzaprine, ondansetron (ZOFRAN)  IV Anti-infectives    None      Results for orders placed or performed during the hospital encounter of 05/30/15 (from the past 48 hour(s))  Glucose, capillary     Status: Abnormal   Collection Time: 05/31/15 12:14 PM  Result Value Ref Range   Glucose-Capillary 127 (H) 65 - 99 mg/dL  Glucose, capillary     Status: Abnormal   Collection Time: 05/31/15  4:41 PM  Result Value Ref Range   Glucose-Capillary 179 (H) 65 - 99 mg/dL   Comment 1 Notify RN    Comment 2 Document in Chart   Glucose, capillary     Status: Abnormal   Collection Time: 05/31/15  9:20 PM  Result Value Ref Range   Glucose-Capillary 188 (H)  65 - 99 mg/dL  Glucose, capillary     Status: Abnormal   Collection Time: 06/01/15  7:34 AM  Result Value Ref Range   Glucose-Capillary 136 (H) 65 - 99 mg/dL  Comprehensive metabolic panel     Status: Abnormal   Collection Time: 06/01/15  7:46 AM  Result Value Ref Range   Sodium 134 (L) 135 - 145 mmol/L   Potassium 3.8 3.5 - 5.1 mmol/L   Chloride 102 101 - 111 mmol/L   CO2 24 22 - 32 mmol/L   Glucose, Bld 151 (H) 65 - 99 mg/dL   BUN 9 6 - 20 mg/dL   Creatinine, Ser 0.78 0.44 - 1.00 mg/dL   Calcium 9.0 8.9 - 10.3 mg/dL   Total Protein 5.9 (L) 6.5 - 8.1 g/dL   Albumin 3.1 (L) 3.5 - 5.0 g/dL   AST 103 (H) 15 - 41 U/L   ALT 197 (H) 14 - 54 U/L   Alkaline Phosphatase 74 38 - 126 U/L   Total Bilirubin 1.6 (H) 0.3 - 1.2 mg/dL   GFR calc non Af Amer >60 >60 mL/min   GFR calc Af Amer >60 >60 mL/min    Comment: (NOTE) The eGFR has been calculated using the CKD EPI equation. This calculation has not been validated in all clinical situations. eGFR's persistently <60 mL/min signify possible Chronic Kidney Disease.    Anion gap 8 5 - 15  Lipase, blood     Status: None   Collection Time: 06/01/15  7:46 AM  Result Value Ref Range   Lipase 23 11 - 51 U/L  Glucose, capillary     Status: Abnormal   Collection Time: 06/01/15 11:36 AM  Result Value Ref Range   Glucose-Capillary  130 (H) 65 - 99 mg/dL  Culture, blood (Routine X 2) w Reflex to ID Panel     Status: None (Preliminary result)   Collection Time: 06/01/15 12:15 PM  Result Value Ref Range   Specimen Description BLOOD RIGHT ANTECUBITAL    Special Requests BOTTLES DRAWN AEROBIC AND ANAEROBIC 5CC    Culture PENDING    Report Status PENDING   Culture, blood (Routine X 2) w Reflex to ID Panel     Status: None (Preliminary result)   Collection Time: 06/01/15 12:25 PM  Result Value Ref Range   Specimen Description BLOOD RIGHT ANTECUBITAL    Special Requests BOTTLES DRAWN AEROBIC AND ANAEROBIC 5CC    Culture PENDING    Report Status PENDING   Glucose, capillary     Status: Abnormal   Collection Time: 06/01/15  5:08 PM  Result Value Ref Range   Glucose-Capillary 109 (H) 65 - 99 mg/dL  Glucose, capillary     Status: Abnormal   Collection Time: 06/01/15 10:48 PM  Result Value Ref Range   Glucose-Capillary 115 (H) 65 - 99 mg/dL  Comprehensive metabolic panel     Status: Abnormal   Collection Time: 06/02/15  5:48 AM  Result Value Ref Range   Sodium 137 135 - 145 mmol/L   Potassium 4.1 3.5 - 5.1 mmol/L   Chloride 106 101 - 111 mmol/L   CO2 23 22 - 32 mmol/L   Glucose, Bld 121 (H) 65 - 99 mg/dL   BUN 13 6 - 20 mg/dL   Creatinine, Ser 0.74 0.44 - 1.00 mg/dL   Calcium 8.9 8.9 - 10.3 mg/dL   Total Protein 5.8 (L) 6.5 - 8.1 g/dL   Albumin 3.0 (L) 3.5 - 5.0 g/dL   AST  45 (H) 15 - 41 U/L   ALT 131 (H) 14 - 54 U/L   Alkaline Phosphatase 61 38 - 126 U/L   Total Bilirubin 0.9 0.3 - 1.2 mg/dL   GFR calc non Af Amer >60 >60 mL/min   GFR calc Af Amer >60 >60 mL/min    Comment: (NOTE) The eGFR has been calculated using the CKD EPI equation. This calculation has not been validated in all clinical situations. eGFR's persistently <60 mL/min signify possible Chronic Kidney Disease.    Anion gap 8 5 - 15  CBC     Status: Abnormal   Collection Time: 06/02/15  5:48 AM  Result Value Ref Range   WBC 4.5 4.0 - 10.5  K/uL   RBC 3.80 (L) 3.87 - 5.11 MIL/uL   Hemoglobin 11.5 (L) 12.0 - 15.0 g/dL   HCT 34.5 (L) 36.0 - 46.0 %   MCV 90.8 78.0 - 100.0 fL   MCH 30.3 26.0 - 34.0 pg   MCHC 33.3 30.0 - 36.0 g/dL   RDW 13.3 11.5 - 15.5 %   Platelets 191 150 - 400 K/uL  Glucose, capillary     Status: Abnormal   Collection Time: 06/02/15  8:20 AM  Result Value Ref Range   Glucose-Capillary 134 (H) 65 - 99 mg/dL    Mr 3d Recon At Scanner  06/01/2015  CLINICAL DATA:  Elevated liver function tests. Diabetes. Hypertension. Nausea vomiting and epigastric abdominal pain. EXAM: MRI ABDOMEN WITHOUT AND WITH CONTRAST (INCLUDING MRCP) TECHNIQUE: Multiplanar multisequence MR imaging of the abdomen was performed both before and after the administration of intravenous contrast. Heavily T2-weighted images of the biliary and pancreatic ducts were obtained, and three-dimensional MRCP images were rendered by post processing. CONTRAST:  33m MULTIHANCE GADOBENATE DIMEGLUMINE 529 MG/ML IV SOLN COMPARISON:  05/31/2015 abdominal ultrasound.  05/30/2015 CT. FINDINGS: Lower chest: Mild cardiomegaly, without pericardial or pleural effusion. Hepatobiliary: Normal liver. Intrahepatic ducts upper normal for patient age. Normal gallbladder. Low cystic duct insertion. Common duct measures maximally 11 mm, including on image 40/series 6. Upper normal in this age group 9-10 mm. At and just above the ampulla, there is incomplete distension, including image 25/series 6. No well-defined common duct stone. On post-contrast images, there is hyperenhancement in this area, including on image 71/ series 11304. Pancreas: Normal, without mass or ductal dilatation. Spleen: Normal in size, without focal abnormality. Adrenals/Urinary Tract: Normal left adrenal gland. A right adrenal myelolipoma measures 4.2 x 3.1 cm. Normal kidneys, without hydronephrosis. Stomach/Bowel: Normal stomach and abdominal bowel loops. There are small nodes in the jejunal mesenteric fat with  increased T1 signal within. Example image 70/ series 13004. Vascular/Lymphatic: Advanced aortic and branch vessel atherosclerosis. No retroperitoneal or retrocrural adenopathy. Other: No ascites. Musculoskeletal: Hyperenhancement at the site of a subacute L3 compression deformity. IMPRESSION: 1. Mild common duct dilatation for age. Area of underdistention at and just above hte ampulla corresponding to post-contrast enhancement. Differential considerations include recent stone passage or a subtle ampullary lesion. Consider surveillance of liver function tests. If the bilirubin is persistently elevated, ERCP should be considered. 2. Right adrenal myelolipoma. 3. Probable mesenteric adenitis/panniculitis, of indeterminate acuity and significance. 4. L3 compression deformity, likely subacute. Electronically Signed   By: KAbigail MiyamotoM.D.   On: 06/01/2015 17:27   Mr Abd W/wo Cm/mrcp  06/01/2015  CLINICAL DATA:  Elevated liver function tests. Diabetes. Hypertension. Nausea vomiting and epigastric abdominal pain. EXAM: MRI ABDOMEN WITHOUT AND WITH CONTRAST (INCLUDING MRCP) TECHNIQUE: Multiplanar multisequence MR imaging of  the abdomen was performed both before and after the administration of intravenous contrast. Heavily T2-weighted images of the biliary and pancreatic ducts were obtained, and three-dimensional MRCP images were rendered by post processing. CONTRAST:  72m MULTIHANCE GADOBENATE DIMEGLUMINE 529 MG/ML IV SOLN COMPARISON:  05/31/2015 abdominal ultrasound.  05/30/2015 CT. FINDINGS: Lower chest: Mild cardiomegaly, without pericardial or pleural effusion. Hepatobiliary: Normal liver. Intrahepatic ducts upper normal for patient age. Normal gallbladder. Low cystic duct insertion. Common duct measures maximally 11 mm, including on image 40/series 6. Upper normal in this age group 9-10 mm. At and just above the ampulla, there is incomplete distension, including image 25/series 6. No well-defined common duct stone.  On post-contrast images, there is hyperenhancement in this area, including on image 71/ series 11304. Pancreas: Normal, without mass or ductal dilatation. Spleen: Normal in size, without focal abnormality. Adrenals/Urinary Tract: Normal left adrenal gland. A right adrenal myelolipoma measures 4.2 x 3.1 cm. Normal kidneys, without hydronephrosis. Stomach/Bowel: Normal stomach and abdominal bowel loops. There are small nodes in the jejunal mesenteric fat with increased T1 signal within. Example image 70/ series 13004. Vascular/Lymphatic: Advanced aortic and branch vessel atherosclerosis. No retroperitoneal or retrocrural adenopathy. Other: No ascites. Musculoskeletal: Hyperenhancement at the site of a subacute L3 compression deformity. IMPRESSION: 1. Mild common duct dilatation for age. Area of underdistention at and just above hte ampulla corresponding to post-contrast enhancement. Differential considerations include recent stone passage or a subtle ampullary lesion. Consider surveillance of liver function tests. If the bilirubin is persistently elevated, ERCP should be considered. 2. Right adrenal myelolipoma. 3. Probable mesenteric adenitis/panniculitis, of indeterminate acuity and significance. 4. L3 compression deformity, likely subacute. Electronically Signed   By: KAbigail MiyamotoM.D.   On: 06/01/2015 17:27   [place] Limited Ru[place]  05/31/2015  CLINICAL DATA:  Elevated liver function test. EXAM: [place] LIMITED - RIGHT UPPER QUADRANT COMPARISON:  CT 05/30/2015. FINDINGS: Gallbladder: Sludge in the gallbladder. Wall thickness mildly prominent 2.5 mm. Negative sonographic Murphy's sign. No layering stones. Common bile duct: Diameter: Mildly increased, 8 mm diameter. Liver: Increased echogenicity.  Slight intrahepatic ductal dilatation. Ancillary finding: As noted from CT there is a large RIGHT adrenal mass, favored to represent myelolipoma. IMPRESSION: Gallbladder sludge without stones. Negative sonographic  Murphy's sign. Mild intra and extra hepatic biliary ductal dilatation.  Steatosis. Large RIGHT adrenal mass. Abnormal LFTs. Electronically Signed   By: JStaci RighterM.D.   On: 05/31/2015 15:12    Review of Systems  Constitutional: Negative.   HENT: Negative.   Eyes: Negative.   Respiratory: Positive for cough and sputum production (clear white color). Negative for hemoptysis, shortness of breath and wheezing.   Cardiovascular: Positive for leg swelling (occasionally). Negative for chest pain, orthopnea and PND.  Gastrointestinal: Positive for heartburn (on PPI), nausea, vomiting, abdominal pain (all the above associated with admission) and constipation (chronic). Negative for diarrhea, blood in stool and melena.  Genitourinary: Negative.   Musculoskeletal: Positive for myalgias and joint pain.  Skin: Negative.   Neurological: Negative.   Endo/Heme/Allergies: Bruises/bleeds easily (on plavix).  Psychiatric/Behavioral: Negative.    Blood pressure 128/51, pulse 63, temperature 97.6 F (36.4 C), temperature source Oral, resp. rate 18, height _0  (1.651 m), weight 67.5 kg (148 lb 13 oz), SpO2 97 %. Physical Exam  Constitutional: She is oriented to person, place, and time. She appears well-developed and well-nourished. No distress.  HENT:  Head: Normocephalic.  Nose: Nose normal.  Mouth/Throat: No oropharyngeal exudate.  Eyes: Right eye exhibits no  discharge. Left eye exhibits no discharge. No scleral icterus.  Neck: Normal range of motion. Neck supple. No JVD present. No tracheal deviation present. No thyromegaly present.  Cardiovascular: Normal rate, regular rhythm, normal heart sounds and intact distal pulses.   No murmur heard. Respiratory: Effort normal and breath sounds normal. No respiratory distress. She has no wheezes. She has no rales. She exhibits no tenderness.  GI: Soft. Bowel sounds are normal. She exhibits no distension and no mass. There is no tenderness. There is no  rebound and no guarding.  Currently pain free.  Musculoskeletal: She exhibits no edema.  Lymphadenopathy:    She has no cervical adenopathy.  Neurological: She is alert and oriented to person, place, and time. No cranial nerve deficit.  Skin: Skin is warm and dry. No rash noted. She is not diaphoretic. No erythema. No pallor.  Psychiatric: She has a normal mood and affect. Her behavior is normal. Judgment and thought content normal.    Assessment/Plan: Abdominal pain and elevated LFT's  - I think she passed another gallstone Cholelithiasis/sludge in gallbladder/CBD dilatations/GB wall thickening Pancreatitis  Hospitalized 02/2015 Seizures/dehydration/hyponatremia  Hospitalized 03/2015 Right adrenal mass (adrenal myelolipoma) Hypertension AODM Seizures with hyponatremia Hx of CAD/ Stent about 6 years ago  On Plavix at home, last dose 05/31/15. Age 17  Plan:  Pt would like to be considered for surgery.  I think we will need to get Cardiology to see and clear.  I will discuss with Dr. Hulen Skains.  Mckay Brandt 06/02/2015, 11:20 AM

## 2015-06-02 NOTE — Consult Note (Signed)
Patient ID: Brittany Boyle MRN: [id number] DOB/AGE: 1921/06/02 82 y.o.  Admit date: 05/30/2015 Referring Physician: Allyson Sabal Primary Cardiologist: Wyline Copas [facility])  Reason for Consultation: Pre-operative risk assessment  HPI: 82 yo female with history of HTN, DM, seizure disorder, pancreatitis, CAD with prior stent placement 2011 at outside hospital admitted to IM service with c/o abdominal pain, nausea and vomiting. She was found to have acute cholecystitis. She has had no chest pain. Cardiology consulted for pre-operative risk assessment prior to planned cholecystectomy. She tells me that she feels well other than the abdominal pain. She is very active at home. She has had no chest pain, dyspnea, LE edema. Dizziness, near syncope or syncope. No palpitations. She has taken daily ASA and Plavix. Plavix was held starting 2 days ago in anticipation of surgery.    Past Medical History  Diagnosis Date  . Hypertension   . Diabetes mellitus without complication ([place])   . Seizures ([place])   . CAD (coronary artery disease)     stent placed in ? vessel in [place] in 2011  . Pancreatitis     Family History  Problem Relation Age of Onset  . Pancreatitis Mother    Social History   Social History  . Marital Status: Married    Spouse Name: N/A  . Number of Children: N/A  . Years of Education: N/A   Occupational History  . Not on file.   Social History Main Topics  . Smoking status: Never Smoker   . Smokeless tobacco: Never Used  . Alcohol Use: No  . Drug Use: No  . Sexual Activity: Not Currently   Other Topics Concern  . Not on file   Social History Narrative    Past Surgical History  Procedure Laterality Date  . Coronary stent placement  2011    Allergies  Allergen Reactions  . Penicillins Rash    Has patient had a PCN reaction causing immediate rash, facial/tongue/throat swelling, SOB or lightheadedness with hypotension: Yes Has patient had a PCN reaction causing severe  rash involving mucus membranes or skin necrosis: No Has patient had a PCN reaction that required hospitalization No Has patient had a PCN reaction occurring within the last 10 years: No If all of the above answers are "NO", then may proceed with Cephalosporin use.   Hospital Medications:  . aspirin EC  81 mg Oral Daily  . enoxaparin (LOVENOX) injection  40 mg Subcutaneous Q24H  . gabapentin  100 mg Oral QHS  . insulin aspart  0-9 Units Subcutaneous TID WC  . levETIRAcetam  500 mg Oral BID  . losartan  100 mg Oral Daily  . metoprolol succinate  50 mg Oral Daily  . multivitamin with minerals  1 tablet Oral Q lunch  . pantoprazole  40 mg Oral Daily  . senna-docusate  1 tablet Oral QHS  . sodium chloride  1 g Oral QODAY    Prior to Admission medications   Medication Sig Start Date End Date Taking? Authorizing Provider  aspirin EC 81 MG tablet Take 81 mg by mouth daily.   Yes Historical Provider, MD  clonazePAM (KLONOPIN) 0.5 MG tablet Take 0.5 mg by mouth 2 (two) times daily as needed for anxiety.   Yes Historical Provider, MD  clopidogrel (PLAVIX) 75 MG tablet Take 75 mg by mouth daily.   Yes Historical Provider, MD  cyclobenzaprine (FLEXERIL) 5 MG tablet Take 5 mg by mouth 3 (three) times daily as needed for muscle spasms.  Yes Historical Provider, MD  gabapentin (NEURONTIN) 100 MG capsule Take 100 mg by mouth at bedtime.   Yes Historical Provider, MD  levETIRAcetam (KEPPRA) 500 MG tablet Take 500 mg by mouth 2 (two) times daily.   Yes Historical Provider, MD  losartan (COZAAR) 100 MG tablet Take 100 mg by mouth daily.   Yes Historical Provider, MD  metFORMIN (GLUCOPHAGE) 500 MG tablet Take 500 mg by mouth 2 (two) times daily with a meal.   Yes Historical Provider, MD  metoprolol succinate (TOPROL-XL) 50 MG 24 hr tablet Take 50 mg by mouth daily. Take with or immediately following a meal.   Yes Historical Provider, MD  Multiple Vitamin (MULTIVITAMIN WITH MINERALS) TABS tablet Take 1  tablet by mouth daily with lunch.   Yes Historical Provider, MD  omeprazole (PRILOSEC) 20 MG capsule Take 20 mg by mouth daily.   Yes Historical Provider, MD  ondansetron (ZOFRAN) 4 MG tablet Take 4 mg by mouth every 8 (eight) hours as needed for nausea or vomiting.   Yes Historical Provider, MD  OVER THE COUNTER MEDICATION Place 1 drop into both eyes daily as needed (dry eyes). OTC lubricating eye drop   Yes Historical Provider, MD  senna-docusate (SENOKOT-S) 8.6-50 MG tablet Take 1 tablet by mouth at bedtime.   Yes Historical Provider, MD  sodium chloride 1 g tablet Take 1 g by mouth every other day.   Yes Historical Provider, MD  spironolactone (ALDACTONE) 25 MG tablet Take 25 mg by mouth daily.   Yes Historical Provider, MD    Review of systems complete and found to be negative unless listed above    Physical Exam: Blood pressure 128/51, pulse 63, temperature 97.6 F (36.4 C), temperature source Oral, resp. rate 18, height 5\' 5"  (1.651 m), weight 148 lb 13 oz (67.5 kg), SpO2 97 %.    General: Well developed, well nourished, NAD  HEENT: OP clear, mucus membranes moist  SKIN: warm, dry. No rashes.  Neuro: No focal deficits  Musculoskeletal: Muscle strength 5/5 all ext  Psychiatric: Mood and affect normal  Neck: No JVD, no carotid bruits, no thyromegaly, no lymphadenopathy.  Lungs:Clear bilaterally, no wheezes, rhonci, crackles  Cardiovascular: Regular rate and rhythm. No murmurs, gallops or rubs.  Abdomen:Soft. Bowel sounds present. Non-tender.  Extremities: No lower extremity edema. Pulses are 2 + in the bilateral DP/PT.   Labs:   Lab Results  Component Value Date   WBC 4.5 06/02/2015   HGB 11.5* 06/02/2015   HCT 34.5* 06/02/2015   MCV 90.8 06/02/2015   PLT 191 06/02/2015     Recent Labs Lab 06/02/15 0548  NA 137  K 4.1  CL 106  CO2 23  BUN 13  CREATININE 0.74  CALCIUM 8.9  PROT 5.8*  BILITOT 0.9  ALKPHOS 61  ALT 131*  AST 45*  GLUCOSE 121*   EKG: sinus,  1st degree AV block. RBBB  ASSESSMENT:   1. CAD with prior stent placement 2. Pre-operative risk assessment 3. Acute cholecystitis  PLAN: She has known CAD with prior PCI but has done well over the last 6 years. She has had no symptoms suggestive of angina, CHF, arrhythmias. She has an overall normal cardiovascular examination. Her EKG does not show ischemic changes. I think she can undergo the planned surgical procedure without further cardiac workup. Would avoid hypotension if possible while undergoing general anesthesia. Her Plavix is on hold for the procedure (Will need 5 days of Plavix washout). We will be available if  needed during the peri-operative period.    Signed: MCALHANY,CHRISTOPHER 06/02/2015, 2:26 PM

## 2015-06-02 NOTE — Progress Notes (Signed)
Physical Therapy Treatment Patient Details Name: Brittany Boyle MRN: [id number] DOB: [birth year] Today's Date: 06/02/2015    History of Present Illness 82 y.o. female with h/o DM, HTN, seizures. Patient lives at home. Patient presents to ED with c/o N/V and epigastric abdominal pain. Symptoms onset this morning, unable to keep any food down. Patient went to UC (results in care everywhere) where her LFTs were noted to be elevated. She was sent to the ED where her LFTs were noted to be even more elevated.    PT Comments    Patient is progressing with her mobility and overall stability. Pt able to ambulate 250 feet with min guard and no assistive device. Pt states that she is planning to go home with her husband's assistance when she is discharged. PT to follow.   Follow Up Recommendations  Outpatient PT     Equipment Recommendations  None recommended by PT    Recommendations for Other Services       Precautions / Restrictions Precautions Precautions: Fall Restrictions Weight Bearing Restrictions: No    Mobility  Bed Mobility Overal bed mobility: Modified Independent             General bed mobility comments: using rail to assist up to sitting  Transfers Overall transfer level: Needs assistance Equipment used: None Transfers: Sit to/from Stand Sit to Stand: Supervision         General transfer comment: no cues needed  Ambulation/Gait Ambulation/Gait assistance: Min guard Ambulation Distance (Feet): 250 Feet Assistive device: None Gait Pattern/deviations: Step-through pattern;Decreased step length - right;Decreased step length - left Gait velocity: wfl for age   General Gait Details: no loss of balance   Stairs            Wheelchair Mobility    Modified Rankin (Stroke Patients Only)       Balance Overall balance assessment: Needs assistance Sitting-balance support: No upper extremity supported Sitting balance-Leahy Scale: Good     Standing  balance support: No upper extremity supported Standing balance-Leahy Scale: Good Standing balance comment: mild dynamic instability but no gross loss of balance.                     Cognition Arousal/Alertness: Awake/alert Behavior During Therapy: WFL for tasks assessed/performed Overall Cognitive Status: Within Functional Limits for tasks assessed                      Exercises      General Comments        Pertinent Vitals/Pain Pain Assessment: No/denies pain    Home Living                      Prior Function            PT Goals (current goals can now be found in the care plan section) Acute Rehab PT Goals Patient Stated Goal: go home PT Goal Formulation: With patient Time For Goal Achievement: 06/08/15 Potential to Achieve Goals: Good Progress towards PT goals: Progressing toward goals    Frequency  Min 2X/week    PT Plan Current plan remains appropriate    Co-evaluation             End of Session Equipment Utilized During Treatment: Gait belt Activity Tolerance: Patient tolerated treatment well Patient left: in chair;with call bell/phone within reach;with family/visitor present     Time: [id number] PT Time Calculation (min) (ACUTE ONLY): 16 min  Charges:  $Gait  Training: 8-22 mins                    G Codes:      Cassell Clement, PT, CSCS Pager [phone] Office [phone]  06/02/2015, 3:31 PM

## 2015-06-03 ENCOUNTER — Encounter (HOSPITAL_COMMUNITY)
Admission: EM | Disposition: A | Discharge status: Home / Self Care | Payer: Self-pay | Source: Home / Self Care | Attending: Internal Medicine

## 2015-06-03 DIAGNOSIS — K7200 Acute and subacute hepatic failure without coma: Secondary | ICD-10-CM

## 2015-06-03 DIAGNOSIS — G40909 Epilepsy, unspecified, not intractable, without status epilepticus: Secondary | ICD-10-CM

## 2015-06-03 LAB — COMPREHENSIVE METABOLIC PANEL
ALT: 90 U/L — AB (ref 14–54)
AST: 27 U/L (ref 15–41)
Albumin: 3 g/dL — ABNORMAL LOW (ref 3.5–5.0)
Alkaline Phosphatase: 62 U/L (ref 38–126)
Anion gap: 10 (ref 5–15)
BUN: 9 mg/dL (ref 6–20)
CHLORIDE: 108 mmol/L (ref 101–111)
CO2: 21 mmol/L — AB (ref 22–32)
Calcium: 8.9 mg/dL (ref 8.9–10.3)
Creatinine, Ser: 0.69 mg/dL (ref 0.44–1.00)
Glucose, Bld: 143 mg/dL — ABNORMAL HIGH (ref 65–99)
POTASSIUM: 3.8 mmol/L (ref 3.5–5.1)
SODIUM: 139 mmol/L (ref 135–145)
Total Bilirubin: 0.8 mg/dL (ref 0.3–1.2)
Total Protein: 5.6 g/dL — ABNORMAL LOW (ref 6.5–8.1)

## 2015-06-03 LAB — GLUCOSE, CAPILLARY
GLUCOSE-CAPILLARY: 136 mg/dL — AB (ref 65–99)
GLUCOSE-CAPILLARY: 235 mg/dL — AB (ref 65–99)

## 2015-06-03 SURGERY — LAPAROSCOPIC CHOLECYSTECTOMY WITH INTRAOPERATIVE CHOLANGIOGRAM
Anesthesia: General

## 2015-06-03 MED ORDER — CIPROFLOXACIN HCL 500 MG PO TABS: 500.0000 mg | ORAL_TABLET | Freq: Two times a day (BID) | ORAL | Status: DC

## 2015-06-03 NOTE — Discharge Instructions (Signed)
Please hold Plavix 5 days before scheduled surgery

## 2015-06-03 NOTE — Care Management Obs Status (Signed)
[place] NOTIFICATION   Patient Details  Name: Dannell Deryke MRN: [id number] Date of Birth: 07-Oct-1921   Medicare Observation Status Notification Given:  Yes    Vonette Grosso, Rory Percy, RN 06/03/2015, 11:34 AM

## 2015-06-03 NOTE — Discharge Summary (Signed)
Physician Discharge Summary  Brittany Boyle MRN: [id number] DOB/AGE: [birth year] 80 y.o.  PCP: Verdell Carmine., MD   Admit date: 05/30/2015 Discharge date: 06/03/2015  Discharge Diagnoses:   Principal Problem:   Acute hepatitis Active Problems:   DM2 (diabetes mellitus, type 2) ([place])   Seizure disorder ([place])   Essential hypertension   Abnormal LFTs    Follow-up recommendations Follow-up with PCP in 3-5 days , including all  additional recommended appointments as below Follow-up CBC, CMP in 3-5 days Patient to call general surgery office to schedule elective cholecystectomy     Medication List    TAKE these medications        aspirin EC 81 MG tablet  Take 81 mg by mouth daily.     ciprofloxacin 500 MG tablet  Commonly known as:  CIPRO  Take 1 tablet (500 mg total) by mouth 2 (two) times daily.     clonazePAM 0.5 MG tablet  Commonly known as:  KLONOPIN  Take 0.5 mg by mouth 2 (two) times daily as needed for anxiety.     clopidogrel 75 MG tablet  Commonly known as:  PLAVIX  Take 75 mg by mouth daily.     cyclobenzaprine 5 MG tablet  Commonly known as:  FLEXERIL  Take 5 mg by mouth 3 (three) times daily as needed for muscle spasms.     gabapentin 100 MG capsule  Commonly known as:  NEURONTIN  Take 100 mg by mouth at bedtime.     levETIRAcetam 500 MG tablet  Commonly known as:  KEPPRA  Take 500 mg by mouth 2 (two) times daily.     losartan 100 MG tablet  Commonly known as:  COZAAR  Take 100 mg by mouth daily.     metFORMIN 500 MG tablet  Commonly known as:  GLUCOPHAGE  Take 500 mg by mouth 2 (two) times daily with a meal.     metoprolol succinate 50 MG 24 hr tablet  Commonly known as:  TOPROL-XL  Take 50 mg by mouth daily. Take with or immediately following a meal.     multivitamin with minerals Tabs tablet  Take 1 tablet by mouth daily with lunch.     omeprazole 20 MG capsule  Commonly known as:  PRILOSEC  Take 20 mg by mouth daily.      ondansetron 4 MG tablet  Commonly known as:  ZOFRAN  Take 4 mg by mouth every 8 (eight) hours as needed for nausea or vomiting.     OVER THE COUNTER MEDICATION  Place 1 drop into both eyes daily as needed (dry eyes). OTC lubricating eye drop     senna-docusate 8.6-50 MG tablet  Commonly known as:  Senokot-S  Take 1 tablet by mouth at bedtime.     sodium chloride 1 g tablet  Take 1 g by mouth every other day.     spironolactone 25 MG tablet  Commonly known as:  ALDACTONE  Take 25 mg by mouth daily.         Discharge Condition: Stable  Discharge Instructions Get Medicines reviewed and adjusted: Please take all your medications with you for your next visit with your Primary MD  Please request your Primary MD to go over all hospital tests and procedure/radiological results at the follow up, please ask your Primary MD to get all Hospital records sent to his/her office.  If you experience worsening of your admission symptoms, develop shortness of breath, life threatening emergency, suicidal or homicidal thoughts you must  seek medical attention immediately by calling 911 or calling your MD immediately if symptoms less severe.  You must read complete instructions/literature along with all the possible adverse reactions/side effects for all the Medicines you take and that have been prescribed to you. Take any new Medicines after you have completely understood and accpet all the possible adverse reactions/side effects.   Do not drive when taking Pain medications.   Do not take more than prescribed Pain, Sleep and Anxiety Medications  Special Instructions: If you have smoked or chewed Tobacco in the last 2 yrs please stop smoking, stop any regular Alcohol and or any Recreational drug use.  Wear Seat belts while driving.  Please note  You were cared for by a hospitalist during your hospital stay. Once you are discharged, your primary care physician will handle any further medical  issues. Please note that NO REFILLS for any discharge medications will be authorized once you are discharged, as it is imperative that you return to your primary care physician (or establish a relationship with a primary care physician if you do not have one) for your aftercare needs so that they can reassess your need for medications and monitor your lab values.      Discharge Instructions    Diet - low sodium heart healthy    Complete by:  As directed      Increase activity slowly    Complete by:  As directed            Allergies  Allergen Reactions  . Penicillins Rash    Has patient had a PCN reaction causing immediate rash, facial/tongue/throat swelling, SOB or lightheadedness with hypotension: Yes Has patient had a PCN reaction causing severe rash involving mucus membranes or skin necrosis: No Has patient had a PCN reaction that required hospitalization No Has patient had a PCN reaction occurring within the last 10 years: No If all of the above answers are "NO", then may proceed with Cephalosporin use.      Disposition: Final discharge disposition not confirmed   Consults: General surgery    Significant Diagnostic Studies:  Ct Abdomen Pelvis W Contrast  05/30/2015  CLINICAL DATA:  Epigastric abdominal pain and nausea and vomiting today. Elevated liver enzymes at a walk in clinic. EXAM: CT ABDOMEN AND PELVIS WITH CONTRAST TECHNIQUE: Multidetector CT imaging of the abdomen and pelvis was performed using the standard protocol following bolus administration of intravenous contrast. CONTRAST:  1 ISOVUE-300 IOPAMIDOL (ISOVUE-300) INJECTION 61% COMPARISON:  None. FINDINGS: Tiny subpleural nodules and calcified granulomas demonstrated in the lung bases, likely postinflammatory. Diffuse fatty infiltration of the liver. Mild bile duct dilatation is likely normal for patient age. Gallbladder, pancreas, spleen, kidneys, abdominal aorta, inferior vena cava, and retroperitoneal lymph nodes  are unremarkable. Large mostly fatty density mass in the right adrenal gland measuring 4.3 cm diameter. 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 The stomach, small bowel, and colon are mostly decompressed. Scattered stool in the colon. There is a vague infiltration in the root of the mesentery. This is nonspecific but could indicate inflammatory process such as mesenteritis. No free air or free fluid in the abdomen. Pelvis: The appendix is normal. Bladder wall is not thickened. Bladder is distended, possibly physiologic or possibly indicating outlet obstruction. Uterus and ovaries are not enlarged. Increased density in the uterus suggesting fibroid. No other pelvic mass or lymphadenopathy. No free or loculated pelvic fluid collections. Degenerative changes in the spine and hips. Compression of the superior endplate of L3 with  approximately 50% loss of height. Suggestion of mild cortical irregularity. This may represent acute compression. No destructive bone lesions. IMPRESSION: Diffuse fatty infiltration of the liver. 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 Vague infiltration in the root of the mesenteric possibly indicating mesenteritis. Nonspecific distention of the bladder. Compression of the superior endplate of L3, possibly acute compression. Electronically Signed   By: Lucienne Capers M.D.   On: 05/30/2015 22:51   Mr 3d Recon At Scanner  06/01/2015  CLINICAL DATA:  Elevated liver function tests. Diabetes. Hypertension. Nausea vomiting and epigastric abdominal pain. EXAM: MRI ABDOMEN WITHOUT AND WITH CONTRAST (INCLUDING MRCP) TECHNIQUE: Multiplanar multisequence MR imaging of the abdomen was performed both before and after the administration of intravenous contrast. Heavily T2-weighted images of the biliary and pancreatic ducts were obtained, and three-dimensional MRCP images were rendered by post processing. CONTRAST:  77m MULTIHANCE GADOBENATE DIMEGLUMINE 529 MG/ML IV SOLN  COMPARISON:  05/31/2015 abdominal ultrasound.  05/30/2015 CT. FINDINGS: Lower chest: Mild cardiomegaly, without pericardial or pleural effusion. Hepatobiliary: Normal liver. Intrahepatic ducts upper normal for patient age. Normal gallbladder. Low cystic duct insertion. Common duct measures maximally 11 mm, including on image 40/series 6. Upper normal in this age group 9-10 mm. At and just above the ampulla, there is incomplete distension, including image 25/series 6. No well-defined common duct stone. On post-contrast images, there is hyperenhancement in this area, including on image 71/ series 11304. Pancreas: Normal, without mass or ductal dilatation. Spleen: Normal in size, without focal abnormality. Adrenals/Urinary Tract: Normal left adrenal gland. A right adrenal myelolipoma measures 4.2 x 3.1 cm. Normal kidneys, without hydronephrosis. Stomach/Bowel: Normal stomach and abdominal bowel loops. There are small nodes in the jejunal mesenteric fat with increased T1 signal within. Example image 70/ series 13004. Vascular/Lymphatic: Advanced aortic and branch vessel atherosclerosis. No retroperitoneal or retrocrural adenopathy. Other: No ascites. Musculoskeletal: Hyperenhancement at the site of a subacute L3 compression deformity. IMPRESSION: 1. Mild common duct dilatation for age. Area of underdistention at and just above hte ampulla corresponding to post-contrast enhancement. Differential considerations include recent stone passage or a subtle ampullary lesion. Consider surveillance of liver function tests. If the bilirubin is persistently elevated, ERCP should be considered. 2. Right adrenal myelolipoma. 3. Probable mesenteric adenitis/panniculitis, of indeterminate acuity and significance. 4. L3 compression deformity, likely subacute. Electronically Signed   By: KAbigail MiyamotoM.D.   On: 06/01/2015 17:27   Mr Abd W/wo Cm/mrcp  06/01/2015  CLINICAL DATA:  Elevated liver function tests. Diabetes. Hypertension.  Nausea vomiting and epigastric abdominal pain. EXAM: MRI ABDOMEN WITHOUT AND WITH CONTRAST (INCLUDING MRCP) TECHNIQUE: Multiplanar multisequence MR imaging of the abdomen was performed both before and after the administration of intravenous contrast. Heavily T2-weighted images of the biliary and pancreatic ducts were obtained, and three-dimensional MRCP images were rendered by post processing. CONTRAST:  121mMULTIHANCE GADOBENATE DIMEGLUMINE 529 MG/ML IV SOLN COMPARISON:  05/31/2015 abdominal ultrasound.  05/30/2015 CT. FINDINGS: Lower chest: Mild cardiomegaly, without pericardial or pleural effusion. Hepatobiliary: Normal liver. Intrahepatic ducts upper normal for patient age. Normal gallbladder. Low cystic duct insertion. Common duct measures maximally 11 mm, including on image 40/series 6. Upper normal in this age group 9-10 mm. At and just above the ampulla, there is incomplete distension, including image 25/series 6. No well-defined common duct stone. On post-contrast images, there is hyperenhancement in this area, including on image 71/ series 11304. Pancreas: Normal, without mass or ductal dilatation. Spleen: Normal in size, without focal abnormality. Adrenals/Urinary Tract: Normal  left adrenal gland. A right adrenal myelolipoma measures 4.2 x 3.1 cm. Normal kidneys, without hydronephrosis. Stomach/Bowel: Normal stomach and abdominal bowel loops. There are small nodes in the jejunal mesenteric fat with increased T1 signal within. Example image 70/ series 13004. Vascular/Lymphatic: Advanced aortic and branch vessel atherosclerosis. No retroperitoneal or retrocrural adenopathy. Other: No ascites. Musculoskeletal: Hyperenhancement at the site of a subacute L3 compression deformity. IMPRESSION: 1. Mild common duct dilatation for age. Area of underdistention at and just above hte ampulla corresponding to post-contrast enhancement. Differential considerations include recent stone passage or a subtle ampullary  lesion. Consider surveillance of liver function tests. If the bilirubin is persistently elevated, ERCP should be considered. 2. Right adrenal myelolipoma. 3. Probable mesenteric adenitis/panniculitis, of indeterminate acuity and significance. 4. L3 compression deformity, likely subacute. Electronically Signed   By: Abigail Miyamoto M.D.   On: 06/01/2015 17:27   US Abdomen Limited Ruq  05/31/2015  CLINICAL DATA:  Elevated liver function test. EXAM: US ABDOMEN LIMITED - RIGHT UPPER QUADRANT COMPARISON:  CT 05/30/2015. FINDINGS: Gallbladder: Sludge in the gallbladder. Wall thickness mildly prominent 2.5 mm. Negative sonographic Murphy's sign. No layering stones. Common bile duct: Diameter: Mildly increased, 8 mm diameter. Liver: Increased echogenicity.  Slight intrahepatic ductal dilatation. Ancillary finding: As noted from CT there is a large RIGHT adrenal mass, favored to represent myelolipoma. IMPRESSION: Gallbladder sludge without stones. Negative sonographic Murphy's sign. Mild intra and extra hepatic biliary ductal dilatation.  Steatosis. Large RIGHT adrenal mass. Abnormal LFTs. Electronically Signed   By: Staci Righter M.D.   On: 05/31/2015 15:12       Filed Weights   06/01/15 0751 06/01/15 2253 06/02/15 2128  Weight: 69 kg (152 lb 1.9 oz) 67.5 kg (148 lb 13 oz) 70 kg (154 lb 5.2 oz)     Microbiology: Recent Results (from the past 240 hour(s))  Culture, blood (Routine X 2) w Reflex to ID Panel     Status: None (Preliminary result)   Collection Time: 06/01/15 12:15 PM  Result Value Ref Range Status   Specimen Description BLOOD RIGHT ANTECUBITAL  Final   Special Requests BOTTLES DRAWN AEROBIC AND ANAEROBIC 5CC  Final   Culture NO GROWTH < 24 HOURS  Final   Report Status PENDING  Incomplete  Culture, blood (Routine X 2) w Reflex to ID Panel     Status: None (Preliminary result)   Collection Time: 06/01/15 12:25 PM  Result Value Ref Range Status   Specimen Description BLOOD RIGHT ANTECUBITAL   Final   Special Requests BOTTLES DRAWN AEROBIC AND ANAEROBIC 5CC  Final   Culture NO GROWTH < 24 HOURS  Final   Report Status PENDING  Incomplete       Blood Culture    Component Value Date/Time   SDES BLOOD RIGHT ANTECUBITAL 06/01/2015 1225   SPECREQUEST BOTTLES DRAWN AEROBIC AND ANAEROBIC 5CC 06/01/2015 1225   CULT NO GROWTH < 24 HOURS 06/01/2015 1225   REPTSTATUS PENDING 06/01/2015 1225      Labs: Results for orders placed or performed during the hospital encounter of 05/30/15 (from the past 48 hour(s))  Culture, blood (Routine X 2) w Reflex to ID Panel     Status: None (Preliminary result)   Collection Time: 06/01/15 12:15 PM  Result Value Ref Range   Specimen Description BLOOD RIGHT ANTECUBITAL    Special Requests BOTTLES DRAWN AEROBIC AND ANAEROBIC 5CC    Culture NO GROWTH < 24 HOURS    Report Status PENDING   Culture, blood (Routine  X 2) w Reflex to ID Panel     Status: None (Preliminary result)   Collection Time: 06/01/15 12:25 PM  Result Value Ref Range   Specimen Description BLOOD RIGHT ANTECUBITAL    Special Requests BOTTLES DRAWN AEROBIC AND ANAEROBIC 5CC    Culture NO GROWTH < 24 HOURS    Report Status PENDING   Glucose, capillary     Status: Abnormal   Collection Time: 06/01/15  5:08 PM  Result Value Ref Range   Glucose-Capillary 109 (H) 65 - 99 mg/dL  Glucose, capillary     Status: Abnormal   Collection Time: 06/01/15 10:48 PM  Result Value Ref Range   Glucose-Capillary 115 (H) 65 - 99 mg/dL  Comprehensive metabolic panel     Status: Abnormal   Collection Time: 06/02/15  5:48 AM  Result Value Ref Range   Sodium 137 135 - 145 mmol/L   Potassium 4.1 3.5 - 5.1 mmol/L   Chloride 106 101 - 111 mmol/L   CO2 23 22 - 32 mmol/L   Glucose, Bld 121 (H) 65 - 99 mg/dL   BUN 13 6 - 20 mg/dL   Creatinine, Ser 0.74 0.44 - 1.00 mg/dL   Calcium 8.9 8.9 - 10.3 mg/dL   Total Protein 5.8 (L) 6.5 - 8.1 g/dL   Albumin 3.0 (L) 3.5 - 5.0 g/dL   AST 45 (H) 15 - 41 U/L    ALT 131 (H) 14 - 54 U/L   Alkaline Phosphatase 61 38 - 126 U/L   Total Bilirubin 0.9 0.3 - 1.2 mg/dL   GFR calc non Af Amer >60 >60 mL/min   GFR calc Af Amer >60 >60 mL/min    Comment: (NOTE) The eGFR has been calculated using the CKD EPI equation. This calculation has not been validated in all clinical situations. eGFR's persistently <60 mL/min signify possible Chronic Kidney Disease.    Anion gap 8 5 - 15  CBC     Status: Abnormal   Collection Time: 06/02/15  5:48 AM  Result Value Ref Range   WBC 4.5 4.0 - 10.5 K/uL   RBC 3.80 (L) 3.87 - 5.11 MIL/uL   Hemoglobin 11.5 (L) 12.0 - 15.0 g/dL   HCT 34.5 (L) 36.0 - 46.0 %   MCV 90.8 78.0 - 100.0 fL   MCH 30.3 26.0 - 34.0 pg   MCHC 33.3 30.0 - 36.0 g/dL   RDW 13.3 11.5 - 15.5 %   Platelets 191 150 - 400 K/uL  Glucose, capillary     Status: Abnormal   Collection Time: 06/02/15  8:20 AM  Result Value Ref Range   Glucose-Capillary 134 (H) 65 - 99 mg/dL  Glucose, capillary     Status: Abnormal   Collection Time: 06/02/15 11:18 AM  Result Value Ref Range   Glucose-Capillary 201 (H) 65 - 99 mg/dL  Glucose, capillary     Status: Abnormal   Collection Time: 06/02/15  4:10 PM  Result Value Ref Range   Glucose-Capillary 166 (H) 65 - 99 mg/dL  Glucose, capillary     Status: Abnormal   Collection Time: 06/02/15  9:24 PM  Result Value Ref Range   Glucose-Capillary 176 (H) 65 - 99 mg/dL  Comprehensive metabolic panel     Status: Abnormal   Collection Time: 06/03/15  5:28 AM  Result Value Ref Range   Sodium 139 135 - 145 mmol/L   Potassium 3.8 3.5 - 5.1 mmol/L   Chloride 108 101 - 111 mmol/L   CO2 21 (  L) 22 - 32 mmol/L   Glucose, Bld 143 (H) 65 - 99 mg/dL   BUN 9 6 - 20 mg/dL   Creatinine, Ser 0.69 0.44 - 1.00 mg/dL   Calcium 8.9 8.9 - 10.3 mg/dL   Total Protein 5.6 (L) 6.5 - 8.1 g/dL   Albumin 3.0 (L) 3.5 - 5.0 g/dL   AST 27 15 - 41 U/L   ALT 90 (H) 14 - 54 U/L   Alkaline Phosphatase 62 38 - 126 U/L   Total Bilirubin 0.8 0.3 -  1.2 mg/dL   GFR calc non Af Amer >60 >60 mL/min   GFR calc Af Amer >60 >60 mL/min    Comment: (NOTE) The eGFR has been calculated using the CKD EPI equation. This calculation has not been validated in all clinical situations. eGFR's persistently <60 mL/min signify possible Chronic Kidney Disease.    Anion gap 10 5 - 15  Glucose, capillary     Status: Abnormal   Collection Time: 06/03/15  7:53 AM  Result Value Ref Range   Glucose-Capillary 136 (H) 65 - 99 mg/dL  Glucose, capillary     Status: Abnormal   Collection Time: 06/03/15 11:03 AM  Result Value Ref Range   Glucose-Capillary 235 (H) 65 - 99 mg/dL   Comment 1 Notify RN    Comment 2 Document in Chart      Lipid Panel  No results found for: CHOL, TRIG, HDL, CHOLHDL, VLDL, LDLCALC, LDLDIRECT   No results found for: HGBA1C   Lab Results  Component Value Date   CREATININE 0.69 06/03/2015     HPI 82 y/o who presented to her with weakness, some nausea and vomiting, epigastric pain day prior. By the time she got to the ED here her pain had resolved. LFT were elevated in Dr. Verdie Drown office. She was admitted to Medicine on 05/30/15.  Work up in the ED showed she had a low grade temp 99. Bilirubin was 3.5, AST 799, ALT 435. Bilirubin went up to 4.1 on 3/26, but all her LFT's are returning to normal. WBC went from 11.5. To 4.5 today. CT scan on admit 05/30/15, Showed: Right adrenal gland mass with fat likely representing myelo lipoma. Vague infiltration in the root of the mesenteric possibly indicating mesenteritis. Nonspecific distention of the bladder. Compression of the superior endplate of L3, possibly acute compression. She has been seen at [facility] for other issues and is now here with Improving LFT's.  MRI on 06/01/15 shows: Mild common duct dilatation for age. Area of underdistention at and just above hte ampulla corresponding to post-contrast enhancement. Differential considerations include recent  stone passage or a subtle ampullary lesion. Consider surveillance of liver function tests. If the bilirubin is persistently elevated, ERCP should be considered. 2. Right adrenal myelolipoma. 3. Probable mesenteric adenitis/panniculitis, of indeterminate acuity and significance. 4. L3 compression deformity, likely subacute.  Recent hospitalizations at [facility] and [facility]:  12/23-27/2016 Pancreatitis/acute cholecystitis at [facility]  1/17-20/20117 At [facility] with suspected seizure with work up finding a meningioma, vasogenic cerebral edema and hyponatremia. Neurosurgeon was consulted for meningioma with cerebral edema. Neurosurgeon was of the opinion that meningioma was chronic and vasogenic edema was minimal and therefore were not responsible for her seizures. Hyponatremia was treated with IV fluids and her sodium improved from 126 to 134 prior to discharge. There were no seizure episodes in the hospital. She was feeling better and she wanted to be discharged home. She was deemed stable  for discharge to home with home PT.    HOSPITAL COURSE:   Acute hepatitis: With mild ductal dilatation, likely secondary to passage of a gallstone. Recent admission at [facility] for gallstone pancreatitis/acute cholecystitis.  Workup here right upper quadrant ultrasound Showed Gallbladder sludge without stones. Negative sonographic Murphy's sign.Mild intra and extra hepatic biliary ductal dilatation. Steatosis.MRCP shows probable mesenteric adenitis/panniculitis, recent passage of stone, possible ampullary lesion Gen. surgery consulted,  More than likely she has passed stones a couple of times without completely obstructing. The chances of her having another attack is fairly high, so I would recommend that she have a cholecystectomy. However, because she is on Plavix Cardiology has recommended a five day washout of her Plavix, which means we cannot operate until Friday. Patient therefore being  discharged home recommended to stop Plavix 5 days prior to surgery/elective laparoscopic cholecystectomy  Pre-op clearance She has known CAD with prior PCI but has done well over the last 6 years. She has had no symptoms suggestive of angina, CHF, arrhythmias. She has an overall normal cardiovascular examination. Her EKG does not show ischemic changes. She  can undergo the planned surgical procedure without further cardiac workup. Would avoid hypotension if possible while undergoing general anesthesia.    History of coronary artery disease on aspirin and Plavix, continue patient on telemetry, cardiac enzymes negative 1 Plavix Held during this hospitalization but restarted prior to discharge due to no anticipated date for surgery at this point Therefore the patient is being discharged home with instructions to hold Plavix for 5 days prior to scheduled surgery    DM2 (diabetes mellitus, type 2) ([place]): Continue sliding scale for now   Seizure disorder (HCC)-continue Klonopin, Keppra,   Essential hypertension controlled    Discharge Exam:    Blood pressure 151/43, pulse 67, temperature 97.8 F (36.6 C), temperature source Oral, resp. rate 18, height 5' 5"  (1.651 m), weight 70 kg (154 lb 5.2 oz), SpO2 96 %.  Cardiovascular: Normal rate, regular rhythm, normal heart sounds and intact distal pulses.  No murmur heard. Respiratory: Effort normal and breath sounds normal. No respiratory distress. She has no wheezes. She has no rales. She exhibits no tenderness.  GI: Soft. Bowel sounds are normal. She exhibits no distension and no mass. There is no tenderness. There is no rebound and no guarding.  Currently pain free.  Musculoskeletal: She exhibits no edema.  Lymphadenopathy:   She has no cervical adenopathy.  Neurological: She is alert and oriented to person, place, and time. No cranial nerve deficit    Follow-up Information    Follow up with Verdell Carmine., MD. Schedule an  appointment as soon as possible for a visit in 3 days.   Specialty:  Family Medicine   Why:  Post-hospital follow-up   Contact information:   [address] [place] [address] [phone]       Follow up with Judeth Horn, MD. Schedule an appointment as soon as possible for a visit in 3 days.   Specialty:  General Surgery   Why:  Please call office to schedule appointment for surgery   Contact information:   [address] [place] [address] [phone]       Signed: Reyne Dumas 06/03/2015, 11:53 AM        Time spent >45 mins

## 2015-06-03 NOTE — Progress Notes (Signed)
Subjective: No pain, she is a little nervous, she was expecting surgery sooner. Her insurance is not will to allow her to stay in hospital until the Plavix is cleared.  In order to avoid insurance issue she will have to go home and come back at a later time as an outpatient.    Objective: Vital signs in last 24 hours: Temp:  [97.4 F (36.3 C)-98.1 F (36.7 C)] 98.1 F (36.7 C) (03/29 0419) Pulse Rate:  [58-66] 61 (03/29 0654) Resp:  [17-18] 17 (03/29 0419) BP: (128-165)/(49-77) 153/49 mmHg (03/29 0654) SpO2:  [95 %-98 %] 95 % (03/29 0419) Weight:  [70 kg (154 lb 5.2 oz)] 70 kg (154 lb 5.2 oz) (03/28 2128) Last BM Date: 05/30/15 240 PO recorded Voided x 5, no BM recorded I am placing her back on heart healthy, carb modified Afebrile, VSS No labs today Intake/Output from previous day: 03/28 0701 - 03/29 0700 In: 1440 [P.O.:240; I.V.:1200] Out: -  Intake/Output this shift:    General appearance: alert, cooperative and no distress GI: soft, non tender, no nausea. + BS  Lab Results:   Recent Labs  06/02/15 0548  WBC 4.5  HGB 11.5*  HCT 34.5*  PLT 191    BMET  Recent Labs  06/01/15 0746 06/02/15 0548  NA 134* 137  K 3.8 4.1  CL 102 106  CO2 24 23  GLUCOSE 151* 121*  BUN 9 13  CREATININE 0.78 0.74  CALCIUM 9.0 8.9   PT/INR No results for input(s): LABPROT, INR in the last 72 hours.   Recent Labs Lab 05/30/15 2031 05/31/15 0209 06/01/15 0746 06/02/15 0548  AST 799* 498* 103* 45*  ALT 435* 421* 197* 131*  ALKPHOS 91 79 74 61  BILITOT 3.5* 4.1* 1.6* 0.9  PROT 7.0 6.6 5.9* 5.8*  ALBUMIN 3.9 3.4* 3.1* 3.0*     Lipase     Component Value Date/Time   LIPASE 23 06/01/2015 0746     Studies/Results: Mr 3d Recon At Scanner  06/01/2015  CLINICAL DATA:  Elevated liver function tests. Diabetes. Hypertension. Nausea vomiting and epigastric abdominal pain. EXAM: MRI ABDOMEN WITHOUT AND WITH CONTRAST (INCLUDING MRCP) TECHNIQUE: Multiplanar multisequence  MR imaging of the abdomen was performed both before and after the administration of intravenous contrast. Heavily T2-weighted images of the biliary and pancreatic ducts were obtained, and three-dimensional MRCP images were rendered by post processing. CONTRAST:  40mL MULTIHANCE GADOBENATE DIMEGLUMINE 529 MG/ML IV SOLN COMPARISON:  05/31/2015 abdominal ultrasound.  05/30/2015 CT. FINDINGS: Lower chest: Mild cardiomegaly, without pericardial or pleural effusion. Hepatobiliary: Normal liver. Intrahepatic ducts upper normal for patient age. Normal gallbladder. Low cystic duct insertion. Common duct measures maximally 11 mm, including on image 40/series 6. Upper normal in this age group 9-10 mm. At and just above the ampulla, there is incomplete distension, including image 25/series 6. No well-defined common duct stone. On post-contrast images, there is hyperenhancement in this area, including on image 71/ series 11304. Pancreas: Normal, without mass or ductal dilatation. Spleen: Normal in size, without focal abnormality. Adrenals/Urinary Tract: Normal left adrenal gland. A right adrenal myelolipoma measures 4.2 x 3.1 cm. Normal kidneys, without hydronephrosis. Stomach/Bowel: Normal stomach and abdominal bowel loops. There are small nodes in the jejunal mesenteric fat with increased T1 signal within. Example image 70/ series 13004. Vascular/Lymphatic: Advanced aortic and branch vessel atherosclerosis. No retroperitoneal or retrocrural adenopathy. Other: No ascites. Musculoskeletal: Hyperenhancement at the site of a subacute L3 compression deformity. IMPRESSION: 1. Mild common duct dilatation for  age. Area of underdistention at and just above hte ampulla corresponding to post-contrast enhancement. Differential considerations include recent stone passage or a subtle ampullary lesion. Consider surveillance of liver function tests. If the bilirubin is persistently elevated, ERCP should be considered. 2. Right adrenal  myelolipoma. 3. Probable mesenteric adenitis/panniculitis, of indeterminate acuity and significance. 4. L3 compression deformity, likely subacute. Electronically Signed   By: Abigail Miyamoto M.D.   On: 06/01/2015 17:27   Mr Abd W/wo Cm/mrcp  06/01/2015  CLINICAL DATA:  Elevated liver function tests. Diabetes. Hypertension. Nausea vomiting and epigastric abdominal pain. EXAM: MRI ABDOMEN WITHOUT AND WITH CONTRAST (INCLUDING MRCP) TECHNIQUE: Multiplanar multisequence MR imaging of the abdomen was performed both before and after the administration of intravenous contrast. Heavily T2-weighted images of the biliary and pancreatic ducts were obtained, and three-dimensional MRCP images were rendered by post processing. CONTRAST:  52mL MULTIHANCE GADOBENATE DIMEGLUMINE 529 MG/ML IV SOLN COMPARISON:  05/31/2015 abdominal ultrasound.  05/30/2015 CT. FINDINGS: Lower chest: Mild cardiomegaly, without pericardial or pleural effusion. Hepatobiliary: Normal liver. Intrahepatic ducts upper normal for patient age. Normal gallbladder. Low cystic duct insertion. Common duct measures maximally 11 mm, including on image 40/series 6. Upper normal in this age group 9-10 mm. At and just above the ampulla, there is incomplete distension, including image 25/series 6. No well-defined common duct stone. On post-contrast images, there is hyperenhancement in this area, including on image 71/ series 11304. Pancreas: Normal, without mass or ductal dilatation. Spleen: Normal in size, without focal abnormality. Adrenals/Urinary Tract: Normal left adrenal gland. A right adrenal myelolipoma measures 4.2 x 3.1 cm. Normal kidneys, without hydronephrosis. Stomach/Bowel: Normal stomach and abdominal bowel loops. There are small nodes in the jejunal mesenteric fat with increased T1 signal within. Example image 70/ series 13004. Vascular/Lymphatic: Advanced aortic and branch vessel atherosclerosis. No retroperitoneal or retrocrural adenopathy. Other: No  ascites. Musculoskeletal: Hyperenhancement at the site of a subacute L3 compression deformity. IMPRESSION: 1. Mild common duct dilatation for age. Area of underdistention at and just above hte ampulla corresponding to post-contrast enhancement. Differential considerations include recent stone passage or a subtle ampullary lesion. Consider surveillance of liver function tests. If the bilirubin is persistently elevated, ERCP should be considered. 2. Right adrenal myelolipoma. 3. Probable mesenteric adenitis/panniculitis, of indeterminate acuity and significance. 4. L3 compression deformity, likely subacute. Electronically Signed   By: Abigail Miyamoto M.D.   On: 06/01/2015 17:27    Medications: . ciprofloxacin  400 mg Intravenous To SS-Surg  . gabapentin  100 mg Oral QHS  . insulin aspart  0-9 Units Subcutaneous TID WC  . levETIRAcetam  500 mg Oral BID  . losartan  100 mg Oral Daily  . metoprolol succinate  50 mg Oral Daily  . multivitamin with minerals  1 tablet Oral Q lunch  . pantoprazole  40 mg Oral Daily  . senna-docusate  1 tablet Oral QHS  . sodium chloride  1 g Oral QODAY    Assessment/Plan Abdominal pain and elevated LFT's - I think she passed another gallstone Cholelithiasis/sludge in gallbladder/CBD dilatations/GB wall thickening Pancreatitis Hospitalized 02/2015 Seizures/dehydration/hyponatremia Hospitalized 03/2015 Right adrenal mass (adrenal myelolipoma) Hypertension AODM Seizures with hyponatremia Hx of CAD/ Stent about 6 years ago On Plavix at home, last dose 05/31/15. Age 82 Antibiotics;  None  DVT: Asprin/Lovenox held for possible surgery today  Plan:  We will allow her to go home and follow up as an outpatient.  I will put information in AVS and send information to our office.  i have  restarted her diet.        LOS: 4 days    Manroop Jakubowicz 06/03/2015 [phone]

## 2015-06-03 NOTE — Progress Notes (Signed)
06/03/2015 2:18 PM  Brittany Boyle to be D/C'd Home per MD order.  Discussed prescriptions and follow up appointments with the patient. Prescriptions given to patient, medication list explained in detail. Pt verbalized understanding.    Medication List    TAKE these medications        aspirin EC 81 MG tablet  Take 81 mg by mouth daily.     ciprofloxacin 500 MG tablet  Commonly known as:  CIPRO  Take 1 tablet (500 mg total) by mouth 2 (two) times daily.     clonazePAM 0.5 MG tablet  Commonly known as:  KLONOPIN  Take 0.5 mg by mouth 2 (two) times daily as needed for anxiety.     clopidogrel 75 MG tablet  Commonly known as:  PLAVIX  Take 75 mg by mouth daily.     cyclobenzaprine 5 MG tablet  Commonly known as:  FLEXERIL  Take 5 mg by mouth 3 (three) times daily as needed for muscle spasms.     gabapentin 100 MG capsule  Commonly known as:  NEURONTIN  Take 100 mg by mouth at bedtime.     levETIRAcetam 500 MG tablet  Commonly known as:  KEPPRA  Take 500 mg by mouth 2 (two) times daily.     losartan 100 MG tablet  Commonly known as:  COZAAR  Take 100 mg by mouth daily.     metFORMIN 500 MG tablet  Commonly known as:  GLUCOPHAGE  Take 500 mg by mouth 2 (two) times daily with a meal.     metoprolol succinate 50 MG 24 hr tablet  Commonly known as:  TOPROL-XL  Take 50 mg by mouth daily. Take with or immediately following a meal.     multivitamin with minerals Tabs tablet  Take 1 tablet by mouth daily with lunch.     omeprazole 20 MG capsule  Commonly known as:  PRILOSEC  Take 20 mg by mouth daily.     ondansetron 4 MG tablet  Commonly known as:  ZOFRAN  Take 4 mg by mouth every 8 (eight) hours as needed for nausea or vomiting.     OVER THE COUNTER MEDICATION  Place 1 drop into both eyes daily as needed (dry eyes). OTC lubricating eye drop     senna-docusate 8.6-50 MG tablet  Commonly known as:  Senokot-S  Take 1 tablet by mouth at bedtime.     sodium chloride 1 g  tablet  Take 1 g by mouth every other day.     spironolactone 25 MG tablet  Commonly known as:  ALDACTONE  Take 25 mg by mouth daily.        Filed Vitals:   06/03/15 0654 06/03/15 0755  BP: 153/49 151/43  Pulse: 61 67  Temp:  97.8 F (36.6 C)  Resp:  18    Skin clean, dry and intact without evidence of skin break down, no evidence of skin tears noted. IV catheter discontinued intact. Site without signs and symptoms of complications. Dressing and pressure applied. Pt denies pain at this time. No complaints noted.  An After Visit Summary was printed and given to the patient. Patient escorted via [place], and D/C home via private auto.  [employer], RN-BC, [facility] Phone [phone]

## 2015-06-06 LAB — CULTURE, BLOOD (ROUTINE X 2)
CULTURE: NO GROWTH
CULTURE: NO GROWTH

## 2015-06-15 ENCOUNTER — Ambulatory Visit: Payer: Self-pay | Admitting: General Surgery

## 2015-06-30 ENCOUNTER — Encounter (HOSPITAL_COMMUNITY): Payer: Self-pay

## 2015-06-30 ENCOUNTER — Encounter (HOSPITAL_COMMUNITY)
Admission: RE | Admit: 2015-06-30 | Discharge: 2015-06-30 | Disposition: A | Discharge status: Home / Self Care | Payer: Medicare Other | Source: Ambulatory Visit | Attending: General Surgery | Admitting: General Surgery

## 2015-06-30 DIAGNOSIS — K859 Acute pancreatitis without necrosis or infection, unspecified: Secondary | ICD-10-CM | POA: Diagnosis present

## 2015-06-30 DIAGNOSIS — Z85828 Personal history of other malignant neoplasm of skin: Secondary | ICD-10-CM | POA: Diagnosis not present

## 2015-06-30 DIAGNOSIS — E119 Type 2 diabetes mellitus without complications: Secondary | ICD-10-CM | POA: Diagnosis not present

## 2015-06-30 DIAGNOSIS — Z7984 Long term (current) use of oral hypoglycemic drugs: Secondary | ICD-10-CM | POA: Diagnosis not present

## 2015-06-30 DIAGNOSIS — E871 Hypo-osmolality and hyponatremia: Secondary | ICD-10-CM | POA: Diagnosis not present

## 2015-06-30 DIAGNOSIS — I10 Essential (primary) hypertension: Secondary | ICD-10-CM | POA: Diagnosis not present

## 2015-06-30 DIAGNOSIS — K8010 Calculus of gallbladder with chronic cholecystitis without obstruction: Secondary | ICD-10-CM | POA: Diagnosis not present

## 2015-06-30 DIAGNOSIS — I2510 Atherosclerotic heart disease of native coronary artery without angina pectoris: Secondary | ICD-10-CM | POA: Diagnosis not present

## 2015-06-30 DIAGNOSIS — I252 Old myocardial infarction: Secondary | ICD-10-CM | POA: Diagnosis not present

## 2015-06-30 DIAGNOSIS — Z7982 Long term (current) use of aspirin: Secondary | ICD-10-CM | POA: Diagnosis not present

## 2015-06-30 DIAGNOSIS — G40909 Epilepsy, unspecified, not intractable, without status epilepticus: Secondary | ICD-10-CM | POA: Diagnosis not present

## 2015-06-30 DIAGNOSIS — Z7902 Long term (current) use of antithrombotics/antiplatelets: Secondary | ICD-10-CM | POA: Diagnosis not present

## 2015-06-30 DIAGNOSIS — Z79899 Other long term (current) drug therapy: Secondary | ICD-10-CM | POA: Diagnosis not present

## 2015-06-30 DIAGNOSIS — E860 Dehydration: Secondary | ICD-10-CM | POA: Diagnosis not present

## 2015-06-30 HISTORY — DX: Headache: R51

## 2015-06-30 HISTORY — DX: Family history of other specified conditions: Z84.89

## 2015-06-30 HISTORY — DX: Malignant (primary) neoplasm, unspecified: C80.1

## 2015-06-30 HISTORY — DX: Acute myocardial infarction, unspecified: I21.9

## 2015-06-30 HISTORY — DX: Headache, unspecified: R51.9

## 2015-06-30 HISTORY — DX: Anxiety disorder, unspecified: F41.9

## 2015-06-30 HISTORY — DX: Adverse effect of unspecified anesthetic, initial encounter: T41.45XA

## 2015-06-30 HISTORY — DX: Other complications of anesthesia, initial encounter: T88.59XA

## 2015-06-30 HISTORY — DX: Unspecified osteoarthritis, unspecified site: M19.90

## 2015-06-30 LAB — CBC WITH DIFFERENTIAL/PLATELET
Basophils Absolute: 0.1 10*3/uL (ref 0.0–0.1)
Basophils Relative: 1 %
EOS ABS: 0.1 10*3/uL (ref 0.0–0.7)
EOS PCT: 2 %
HCT: 39.9 % (ref 36.0–46.0)
Hemoglobin: 13.7 g/dL (ref 12.0–15.0)
LYMPHS ABS: 2.3 10*3/uL (ref 0.7–4.0)
LYMPHS PCT: 34 %
MCH: 30.6 pg (ref 26.0–34.0)
MCHC: 34.3 g/dL (ref 30.0–36.0)
MCV: 89.3 fL (ref 78.0–100.0)
MONOS PCT: 9 %
Monocytes Absolute: 0.6 10*3/uL (ref 0.1–1.0)
Neutro Abs: 3.6 10*3/uL (ref 1.7–7.7)
Neutrophils Relative %: 54 %
PLATELETS: 215 10*3/uL (ref 150–400)
RBC: 4.47 MIL/uL (ref 3.87–5.11)
RDW: 12.6 % (ref 11.5–15.5)
WBC: 6.7 10*3/uL (ref 4.0–10.5)

## 2015-06-30 LAB — COMPREHENSIVE METABOLIC PANEL
ALK PHOS: 57 U/L (ref 38–126)
ALT: 17 U/L (ref 14–54)
ANION GAP: 11 (ref 5–15)
AST: 22 U/L (ref 15–41)
Albumin: 4 g/dL (ref 3.5–5.0)
BUN: 8 mg/dL (ref 6–20)
CO2: 24 mmol/L (ref 22–32)
CREATININE: 0.71 mg/dL (ref 0.44–1.00)
Calcium: 9.5 mg/dL (ref 8.9–10.3)
Chloride: 98 mmol/L — ABNORMAL LOW (ref 101–111)
Glucose, Bld: 155 mg/dL — ABNORMAL HIGH (ref 65–99)
Potassium: 4.2 mmol/L (ref 3.5–5.1)
SODIUM: 133 mmol/L — AB (ref 135–145)
TOTAL PROTEIN: 7.1 g/dL (ref 6.5–8.1)
Total Bilirubin: 0.5 mg/dL (ref 0.3–1.2)

## 2015-06-30 LAB — GLUCOSE, CAPILLARY: Glucose-Capillary: 153 mg/dL — ABNORMAL HIGH (ref 65–99)

## 2015-06-30 NOTE — Progress Notes (Signed)
Per. Pt. Report she has not had a stress test or Echo since stent placement in 2011. Pt. Followed with PCP at Dr. Verdie Drown office in [facility] family Practice.

## 2015-06-30 NOTE — Progress Notes (Addendum)
Anesthesia Chart Review: Patient is a 82 year old female scheduled for laparoscopic cholecystectomy on 07/03/15 by Dr. Hulen Skains.  History includes non-smoker, HTN, DM2, CAD/MI s/p PCI '11, right adrenal myelolipoma (05/2015 CT), L3 compression deformity (likely subacute, 05/2015 CT), fatty liver (05/2015 CT), arthritis, headaches, skin cancer excision (nose). She reported a remote history of bing slow to awaken from anesthesia following a D&C.  Most recent hospitalizations include: - 05/30/15-06/03/15 [facility]:  Acute hepatitis likely secondary to passage of a gallstone. Cholecystectomy recommended. Cardiology clearance received by Dr. Angelena Form on 06/02/15. Surgery was delayed due to need for Plavix washout. He recommended to avoid hypotension if possible while undergoing general anesthesia. - 03/24/15-03/27/15 [facility]: Suspected seizure in the setting of dehydration and hyponatremia. Work up showing a meningioma, vasogenic cerebral edema and hyponatremia. Neurosurgeon (Dr. Katherine Roan) was consulted for meningioma with cerebral edema. Neurosurgeon was of the opinion that meningioma was chronic and vasogenic edema was minimal and therefore were not responsible for her seizures. Hyponatremia treated with IVF with improvement from 126 to 134. Keppra started with 6 month follow-up with Dr. Katherine Roan recommended. - 02/27/15-03/03/15 [facility]: Pancreatitis/acute cholecystitis   PCP is Dr. Ward Givens with [facility]. She was last seen on 06/08/15 by Lorna Few, PA-C for hospital follow-up. Primary cardiologist is Dr. Earlie Counts at [facility] in [facility].   Meds include ASA 81 mg (on hold), Plavix (on hold), clonazepam, Flexeril, Neurontin, Keppra, losartan, metformin, Toprol XL, Prilosec, Zofran, sodium chloride, spironolactone.  PAT Vitals: BP 155/62, HR 82, RR 20, T 36.3C, O2 sat 100%. CBG 153. BMI 23.  05/30/15 EKG: SR with first degree AVB, right BBB.  She reported last stress and  echo were done prior to her cardiac cath ~ 2011.   05/30/15 CXR [facility]; Care Everywhere): FINDINGS: Cardiac silhouette is top-normal in size. No mediastinal or hilar masses or evidence of adenopathy. Stable right upper lobe calcified granuloma. Mild scarring at the apices. No evidence of pneumonia or pulmonary edema. No pleural effusion or pneumothorax. Lungs are hyperexpanded. Bony thorax is demineralized but grossly intact.  IMPRESSION: 1. No acute cardiopulmonary disease.  03/25/15 MRI Brain [facility]; Care Everywhere): IMPRESSION: 1. 2.7 x 3.3 x 3.7 cm enhancing dural-based mass lesion along the greater wing and left sphenoid compatible with a meningioma. 2. Local mass effect on the left temporal lobe could be related to the patient's seizures. 3. Vasogenic edema extends into the left frontal operculum. 4. Mild to moderate periventricular and subcortical white matter changes bilaterally. These are likely related to chronic microvascular ischemia.  Preoperative labs noted. Cr 0.71. Na 133. AST/ALT WNL. CBC WNL. Glucose 155. A1c is pending.  She denied recent cardiac testing other than EKGs. Dr. Earlie Counts was previously with [facility] prior to becoming a part of [facility] last year. I will request records from [facility] and leave chart for follow-up.  [facility] Short Stay Center/Anesthesiology Phone [phone] 06/30/2015 5:13 PM  Addendum: Records from [facility] received: - 06/25/12 Echo [facility]): Summary: Mild concentric LVH. Normal LV systolic function without segmental abnormality. Calculated EF 81.7% with estimated EF 60%. No significant Doppler findings. No significant valvular abnormalities (mild AR)  - 06/25/12 Nuclear stress test [facility]): Conclusion: No infarct or ischemia is identified on the SPECT study. Wall motion and EF analyses are WNL (high normal at approximately 80%).  - 03/19/14 office note (Dr. Wyline Copas)  indicating that on 11/24/05 patient had a cardiac cath showing 40% distal LAD;  95% mid CXA stented to 0% with PTCA to OM side-branch through the stent; 25% mid RCA and 30% distal RCA.  Patient previously with cardiology clearance per Dr. Angelena Form. If no acute changes then I anticipate that she can proceed as planned.  [facility] Short Stay Center/Anesthesiology Phone [phone] 07/01/2015 3:38 PM

## 2015-06-30 NOTE — Progress Notes (Signed)
Call to pt.'s daughter & Pharm. Tech for help with med. Reconciliation.

## 2015-06-30 NOTE — Pre-Procedure Instructions (Signed)
Brittany Boyle  06/30/2015      [facility] SERVICE - [place], [place] [address] [place] [address] [place] [address] Phone: [phone] Fax: [place], [place] - [address] [place] [address] Phone: [phone] Fax: [phone]    Your procedure is scheduled on : 07/03/2015  Report to [facility] Admitting at 5:30 A.M.  Call this number if you have problems the morning of surgery:  [phone]   Remember:  Do not eat food or drink liquids after midnight.  On Thursday night    Take these medicines the morning of surgery with A SIP OF WATER :  KEPPRA ( SEIZURE pill), METOPROLOL        AND IF NEEDED take Clonazepam, Omeprazole OR Cyclobenzaprine                 CONTINUE TO NOT TAKE PLAVIX & ASPIRIN   Do not wear jewelry, make-up or nail polish.   Do not wear lotions, powders, or perfumes.  You may wear deodorant.   Do not shave 48 hours prior to surgery.   Do not bring valuables to the hospital.   [facility] is not responsible for any belongings or valuables.  Contacts, dentures or bridgework may not be worn into surgery.  Leave your suitcase in the car.  After surgery it may be brought to your room.  For patients admitted to the hospital, discharge time will be determined by your treatment team.  Patients discharged the day of surgery will not be allowed to drive home.   Name and phone number of your driver:  With family   Special instructions:   How to Manage Your Diabetes Before and After Surgery  Why is it important to control my blood sugar before and after surgery? . Improving blood sugar levels before and after surgery helps healing and can limit problems. . A way of improving blood sugar control is eating a healthy diet by: o  Eating less sugar and carbohydrates o  Increasing activity/exercise o  Talking with your doctor about reaching your blood sugar  goals . High blood sugars (greater than 180 mg/dL) can raise your risk of infections and slow your recovery, so you will need to focus on controlling your diabetes during the weeks before surgery. . Make sure that the doctor who takes care of your diabetes knows about your planned surgery including the date and location.  How do I manage my blood sugar before surgery? . Check your blood sugar at least 4 times a day, starting 2 days before surgery, to make sure that the level is not too high or low. o Check your blood sugar the morning of your surgery when you wake up and every 2 hours until you get to the Short Stay unit. . If your blood sugar is less than 70 mg/dL, you will need to treat for low blood sugar: o Do not take insulin. o Treat a low blood sugar (less than 70 mg/dL) with  cup of clear juice (cranberry or apple), 4 glucose tablets, OR glucose gel. o Recheck blood sugar in 15 minutes after treatment (to make sure it is greater than 70 mg/dL). If your blood sugar is not greater than 70 mg/dL on recheck, call [phone] for further instructions. . Report your blood sugar to the short stay nurse when you get to Short Stay.  . If you  are admitted to the hospital after surgery: o Your blood sugar will be checked by the staff and you will probably be given insulin after surgery (instead of oral diabetes medicines) to make sure you have good blood sugar levels. o The goal for blood sugar control after surgery is 80-180 mg/dL.              WHAT DO I DO ABOUT MY DIABETES MEDICATION?   Marland Kitchen Do not take oral diabetes medicines (pills) the morning of surgery.  .       .     Other Instructions:          Patient Signature:  Date:   Nurse Signature:  Date:   Reviewed and Endorsed by [facility] Patient Education Committee, August 2015 Special Instructions: [facility] - Preparing for Surgery  Before surgery, you can play an important role.  Because skin is not sterile,  your skin needs to be as free of germs as possible.  You can reduce the number of germs on you skin by washing with CHG (chlorahexidine gluconate) soap before surgery.  CHG is an antiseptic cleaner which kills germs and bonds with the skin to continue killing germs even after washing.  Please DO NOT use if you have an allergy to CHG or antibacterial soaps.  If your skin becomes reddened/irritated stop using the CHG and inform your nurse when you arrive at Short Stay.  Do not shave (including legs and underarms) for at least 48 hours prior to the first CHG shower.  You may shave your face.  Please follow these instructions carefully:   1.  Shower with CHG Soap the night before surgery and the  morning of Surgery.  2.  If you choose to wash your hair, wash your hair first as usual with your  normal shampoo.  3.  After you shampoo, rinse your hair and body thoroughly to remove the  Shampoo.  4.  Use CHG as you would any other liquid soap.  You can apply chg directly to the skin and wash gently with scrungie or a clean washcloth.  5.  Apply the CHG Soap to your body ONLY FROM THE NECK DOWN.    Do not use on open wounds or open sores.  Avoid contact with your eyes, ears, mouth and genitals (private parts).  Wash genitals (private parts)   with your normal soap.  6.  Wash thoroughly, paying special attention to the area where your surgery will be performed.  7.  Thoroughly rinse your body with warm water from the neck down.  8.  DO NOT shower/wash with your normal soap after using and rinsing off   the CHG Soap.  9.  Pat yourself dry with a clean towel.            10.  Wear clean pajamas.            11.  Place clean sheets on your bed the night of your first shower and do not sleep with pets.  Day of Surgery  Do not apply any lotions/deodorants the morning of surgery.  Please wear clean clothes to the hospital/surgery center.  Please read over the following fact sheets that you were given. Pain  Booklet, Coughing and Deep Breathing and Surgical Site Infection Prevention

## 2015-07-01 ENCOUNTER — Encounter (HOSPITAL_COMMUNITY): Payer: Self-pay

## 2015-07-01 LAB — HEMOGLOBIN A1C
HEMOGLOBIN A1C: 7.1 % — AB (ref 4.8–5.6)
MEAN PLASMA GLUCOSE: 157 mg/dL

## 2015-07-02 MED ORDER — CIPROFLOXACIN IN D5W 400 MG/200ML IV SOLN
400.0000 mg | INTRAVENOUS | Status: AC
  Administered 2015-07-03: 400 mg via INTRAVENOUS
  Filled 2015-07-02: qty 200

## 2015-07-03 ENCOUNTER — Encounter (HOSPITAL_COMMUNITY)
Admission: RE | Disposition: A | Discharge status: Home / Self Care | Payer: Self-pay | Source: Ambulatory Visit | Attending: General Surgery

## 2015-07-03 ENCOUNTER — Ambulatory Visit (HOSPITAL_COMMUNITY)
Admission: RE | Admit: 2015-07-03 | Discharge: 2015-07-03 | Disposition: A | Discharge status: Home / Self Care | Payer: Medicare Other | Source: Ambulatory Visit | Attending: General Surgery | Admitting: General Surgery

## 2015-07-03 ENCOUNTER — Ambulatory Visit (HOSPITAL_COMMUNITY): Payer: Medicare Other

## 2015-07-03 ENCOUNTER — Ambulatory Visit (HOSPITAL_COMMUNITY): Payer: Medicare Other | Admitting: Vascular Surgery

## 2015-07-03 ENCOUNTER — Ambulatory Visit (HOSPITAL_COMMUNITY): Payer: Medicare Other | Admitting: Certified Registered Nurse Anesthetist

## 2015-07-03 ENCOUNTER — Encounter (HOSPITAL_COMMUNITY): Payer: Self-pay | Admitting: Surgery

## 2015-07-03 DIAGNOSIS — Z7984 Long term (current) use of oral hypoglycemic drugs: Secondary | ICD-10-CM | POA: Insufficient documentation

## 2015-07-03 DIAGNOSIS — I2510 Atherosclerotic heart disease of native coronary artery without angina pectoris: Secondary | ICD-10-CM | POA: Diagnosis not present

## 2015-07-03 DIAGNOSIS — K8010 Calculus of gallbladder with chronic cholecystitis without obstruction: Secondary | ICD-10-CM | POA: Insufficient documentation

## 2015-07-03 DIAGNOSIS — G40909 Epilepsy, unspecified, not intractable, without status epilepticus: Secondary | ICD-10-CM | POA: Insufficient documentation

## 2015-07-03 DIAGNOSIS — E119 Type 2 diabetes mellitus without complications: Secondary | ICD-10-CM | POA: Diagnosis not present

## 2015-07-03 DIAGNOSIS — I10 Essential (primary) hypertension: Secondary | ICD-10-CM | POA: Insufficient documentation

## 2015-07-03 DIAGNOSIS — E871 Hypo-osmolality and hyponatremia: Secondary | ICD-10-CM | POA: Insufficient documentation

## 2015-07-03 DIAGNOSIS — K859 Acute pancreatitis without necrosis or infection, unspecified: Secondary | ICD-10-CM | POA: Insufficient documentation

## 2015-07-03 DIAGNOSIS — I252 Old myocardial infarction: Secondary | ICD-10-CM | POA: Insufficient documentation

## 2015-07-03 DIAGNOSIS — Z7902 Long term (current) use of antithrombotics/antiplatelets: Secondary | ICD-10-CM | POA: Insufficient documentation

## 2015-07-03 DIAGNOSIS — E860 Dehydration: Secondary | ICD-10-CM | POA: Insufficient documentation

## 2015-07-03 DIAGNOSIS — Z79899 Other long term (current) drug therapy: Secondary | ICD-10-CM | POA: Insufficient documentation

## 2015-07-03 DIAGNOSIS — Z85828 Personal history of other malignant neoplasm of skin: Secondary | ICD-10-CM | POA: Insufficient documentation

## 2015-07-03 DIAGNOSIS — K819 Cholecystitis, unspecified: Secondary | ICD-10-CM

## 2015-07-03 DIAGNOSIS — K8050 Calculus of bile duct without cholangitis or cholecystitis without obstruction: Secondary | ICD-10-CM

## 2015-07-03 DIAGNOSIS — Z7982 Long term (current) use of aspirin: Secondary | ICD-10-CM | POA: Insufficient documentation

## 2015-07-03 HISTORY — PX: CHOLECYSTECTOMY: SHX55

## 2015-07-03 LAB — GLUCOSE, CAPILLARY
Glucose-Capillary: 129 mg/dL — ABNORMAL HIGH (ref 65–99)
Glucose-Capillary: 154 mg/dL — ABNORMAL HIGH (ref 65–99)

## 2015-07-03 SURGERY — LAPAROSCOPIC CHOLECYSTECTOMY WITH INTRAOPERATIVE CHOLANGIOGRAM
Anesthesia: General | Site: Abdomen

## 2015-07-03 MED ORDER — LIDOCAINE HCL (CARDIAC) 20 MG/ML IV SOLN
INTRAVENOUS | Status: DC | PRN
  Administered 2015-07-03: 90 mg via INTRAVENOUS

## 2015-07-03 MED ORDER — PHENYLEPHRINE HCL 10 MG/ML IJ SOLN
INTRAMUSCULAR | Status: DC | PRN
  Administered 2015-07-03 (×2): 200 ug via INTRAVENOUS

## 2015-07-03 MED ORDER — BUPIVACAINE-EPINEPHRINE (PF) 0.25% -1:200000 IJ SOLN
INTRAMUSCULAR | Status: AC
  Filled 2015-07-03: qty 30

## 2015-07-03 MED ORDER — IOPAMIDOL (ISOVUE-300) INJECTION 61%
INTRAVENOUS | Status: AC
  Filled 2015-07-03: qty 50

## 2015-07-03 MED ORDER — ONDANSETRON HCL 4 MG/2ML IJ SOLN
INTRAMUSCULAR | Status: DC | PRN
  Administered 2015-07-03: 4 mg via INTRAVENOUS

## 2015-07-03 MED ORDER — SODIUM CHLORIDE 0.9 % IR SOLN
Status: DC | PRN
  Administered 2015-07-03: 1000 mL

## 2015-07-03 MED ORDER — PROPOFOL 10 MG/ML IV BOLUS
INTRAVENOUS | Status: DC | PRN
  Administered 2015-07-03 (×2): 30 mg via INTRAVENOUS
  Administered 2015-07-03: 100 mg via INTRAVENOUS

## 2015-07-03 MED ORDER — FENTANYL CITRATE (PF) 250 MCG/5ML IJ SOLN
INTRAMUSCULAR | Status: AC
  Filled 2015-07-03: qty 5

## 2015-07-03 MED ORDER — CHLORHEXIDINE GLUCONATE 4 % EX LIQD: 1.0000 "application " | Freq: Once | CUTANEOUS | Status: DC

## 2015-07-03 MED ORDER — ROCURONIUM BROMIDE 100 MG/10ML IV SOLN
INTRAVENOUS | Status: DC | PRN
  Administered 2015-07-03: 40 mg via INTRAVENOUS
  Administered 2015-07-03: 10 mg via INTRAVENOUS

## 2015-07-03 MED ORDER — PROPOFOL 10 MG/ML IV BOLUS
INTRAVENOUS | Status: AC
  Filled 2015-07-03: qty 20

## 2015-07-03 MED ORDER — MEPERIDINE HCL 25 MG/ML IJ SOLN: 6.2500 mg | INTRAMUSCULAR | Status: DC | PRN

## 2015-07-03 MED ORDER — FENTANYL CITRATE (PF) 100 MCG/2ML IJ SOLN
INTRAMUSCULAR | Status: DC | PRN
  Administered 2015-07-03: 100 ug via INTRAVENOUS
  Administered 2015-07-03 (×3): 50 ug via INTRAVENOUS

## 2015-07-03 MED ORDER — METOCLOPRAMIDE HCL 5 MG/ML IJ SOLN: 10.0000 mg | Freq: Once | INTRAMUSCULAR | Status: DC | PRN

## 2015-07-03 MED ORDER — 0.9 % SODIUM CHLORIDE (POUR BTL) OPTIME
TOPICAL | Status: DC | PRN
  Administered 2015-07-03: 1000 mL

## 2015-07-03 MED ORDER — SODIUM CHLORIDE 0.9 % IV SOLN
INTRAVENOUS | Status: DC | PRN
  Administered 2015-07-03: 16 mL

## 2015-07-03 MED ORDER — SUGAMMADEX SODIUM 200 MG/2ML IV SOLN
INTRAVENOUS | Status: DC | PRN
  Administered 2015-07-03: 150 mg via INTRAVENOUS

## 2015-07-03 MED ORDER — TRAMADOL HCL 50 MG PO TABS: 50.0000 mg | ORAL_TABLET | Freq: Four times a day (QID) | ORAL | Status: DC | PRN

## 2015-07-03 MED ORDER — FENTANYL CITRATE (PF) 100 MCG/2ML IJ SOLN: 25.0000 ug | INTRAMUSCULAR | Status: DC | PRN

## 2015-07-03 MED ORDER — LACTATED RINGERS IV SOLN
INTRAVENOUS | Status: DC | PRN
  Administered 2015-07-03 (×2): via INTRAVENOUS

## 2015-07-03 MED ORDER — BUPIVACAINE-EPINEPHRINE 0.25% -1:200000 IJ SOLN
INTRAMUSCULAR | Status: DC | PRN
Start: 2015-07-03 — End: 2015-07-03
  Administered 2015-07-03: 19 mL

## 2015-07-03 SURGICAL SUPPLY — 45 items
APPLIER CLIP 5 13 M/L LIGAMAX5 (MISCELLANEOUS) ×3
BLADE SURG ROTATE 9660 (MISCELLANEOUS) IMPLANT
CANISTER SUCTION 2500CC (MISCELLANEOUS) ×3 IMPLANT
CHLORAPREP W/TINT 26ML (MISCELLANEOUS) ×3 IMPLANT
CLIP APPLIE 5 13 M/L LIGAMAX5 (MISCELLANEOUS) ×1 IMPLANT
CLOSURE WOUND 1/2 X4 (GAUZE/BANDAGES/DRESSINGS) ×1
COVER MAYO STAND STRL (DRAPES) ×3 IMPLANT
COVER SURGICAL LIGHT HANDLE (MISCELLANEOUS) ×3 IMPLANT
DERMABOND ADVANCED (GAUZE/BANDAGES/DRESSINGS) ×2
DERMABOND ADVANCED .7 DNX12 (GAUZE/BANDAGES/DRESSINGS) ×1 IMPLANT
DRAPE C-ARM 42X72 X-RAY (DRAPES) ×3 IMPLANT
DRSG TEGADERM 2-3/8X2-3/4 SM (GAUZE/BANDAGES/DRESSINGS) ×12 IMPLANT
ELECT REM PT RETURN 9FT ADLT (ELECTROSURGICAL) ×3
ELECTRODE REM PT RTRN 9FT ADLT (ELECTROSURGICAL) ×1 IMPLANT
GLOVE BIO SURGEON STRL SZ7.5 (GLOVE) ×3 IMPLANT
GLOVE BIOGEL PI IND STRL 7.0 (GLOVE) ×1 IMPLANT
GLOVE BIOGEL PI IND STRL 7.5 (GLOVE) ×2 IMPLANT
GLOVE BIOGEL PI IND STRL 8 (GLOVE) ×1 IMPLANT
GLOVE BIOGEL PI INDICATOR 7.0 (GLOVE) ×2
GLOVE BIOGEL PI INDICATOR 7.5 (GLOVE) ×4
GLOVE BIOGEL PI INDICATOR 8 (GLOVE) ×2
GLOVE ECLIPSE 7.0 STRL STRAW (GLOVE) ×3 IMPLANT
GLOVE ECLIPSE 7.5 STRL STRAW (GLOVE) ×6 IMPLANT
GLOVE SURG SS PI 7.0 STRL IVOR (GLOVE) ×3 IMPLANT
GOWN STRL REUS W/ TWL LRG LVL3 (GOWN DISPOSABLE) ×3 IMPLANT
GOWN STRL REUS W/TWL LRG LVL3 (GOWN DISPOSABLE) ×6
GOWN STRL REUS W/TWL XL LVL3 (GOWN DISPOSABLE) ×3 IMPLANT
KIT BASIN OR (CUSTOM PROCEDURE TRAY) ×3 IMPLANT
KIT ROOM TURNOVER OR (KITS) ×3 IMPLANT
NS IRRIG 1000ML POUR BTL (IV SOLUTION) ×3 IMPLANT
PAD ARMBOARD 7.5X6 YLW CONV (MISCELLANEOUS) ×3 IMPLANT
POUCH SPECIMEN RETRIEVAL 10MM (ENDOMECHANICALS) ×3 IMPLANT
SCISSORS LAP 5X35 DISP (ENDOMECHANICALS) ×3 IMPLANT
SET CHOLANGIOGRAPH 5 50 .035 (SET/KITS/TRAYS/PACK) ×3 IMPLANT
SET IRRIG TUBING LAPAROSCOPIC (IRRIGATION / IRRIGATOR) ×3 IMPLANT
SLEEVE ENDOPATH XCEL 5M (ENDOMECHANICALS) ×6 IMPLANT
SPECIMEN JAR SMALL (MISCELLANEOUS) ×3 IMPLANT
STRIP CLOSURE SKIN 1/2X4 (GAUZE/BANDAGES/DRESSINGS) ×2 IMPLANT
SUT MNCRL AB 4-0 PS2 18 (SUTURE) ×3 IMPLANT
TOWEL OR 17X24 6PK STRL BLUE (TOWEL DISPOSABLE) ×3 IMPLANT
TOWEL OR 17X26 10 PK STRL BLUE (TOWEL DISPOSABLE) IMPLANT
TRAY LAPAROSCOPIC MC (CUSTOM PROCEDURE TRAY) ×3 IMPLANT
TROCAR XCEL BLUNT TIP 100MML (ENDOMECHANICALS) ×3 IMPLANT
TROCAR XCEL NON-BLD 5MMX100MML (ENDOMECHANICALS) ×3 IMPLANT
TUBING INSUFFLATION (TUBING) ×3 IMPLANT

## 2015-07-03 NOTE — Anesthesia Procedure Notes (Signed)
Procedure Name: Intubation Date/Time: 07/03/2015 7:46 AM Performed by: Shirlyn Goltz Pre-anesthesia Checklist: Patient identified, Emergency Drugs available, Suction available and Patient being monitored Patient Re-evaluated:Patient Re-evaluated prior to inductionOxygen Delivery Method: Circle system utilized Preoxygenation: Pre-oxygenation with 100% oxygen Intubation Type: IV induction Ventilation: Mask ventilation without difficulty and Oral airway inserted - appropriate to patient size Laryngoscope Size: Sabra Heck and 2 Grade View: Grade I Tube type: Oral Tube size: 7.0 mm Number of attempts: 1 Airway Equipment and Method: Stylet Placement Confirmation: positive ETCO2,  ETT inserted through vocal cords under direct vision and breath sounds checked- equal and bilateral Secured at: 21 cm Tube secured with: Tape Dental Injury: Teeth and Oropharynx as per pre-operative assessment

## 2015-07-03 NOTE — Anesthesia Postprocedure Evaluation (Signed)
Anesthesia Post Note  Patient: Brittany Boyle  Procedure(s) Performed: Procedure(s) (LRB): LAPAROSCOPIC CHOLECYSTECTOMY WITH INTRAOPERATIVE CHOLANGIOGRAM (N/A)  Patient location during evaluation: PACU Anesthesia Type: General Level of consciousness: awake and alert Pain management: pain level controlled Vital Signs Assessment: post-procedure vital signs reviewed and stable Respiratory status: spontaneous breathing, nonlabored ventilation, respiratory function stable and patient connected to nasal cannula oxygen Cardiovascular status: blood pressure returned to baseline and stable Postop Assessment: no signs of nausea or vomiting Anesthetic complications: no    Last Vitals:  Filed Vitals:   07/03/15 0930 07/03/15 0953  BP: 140/60 167/71  Pulse: 72 68  Temp: 36.4 C   Resp: 12     Last Pain:  Filed Vitals:   07/03/15 0955  PainSc: 1                  Montez Hageman

## 2015-07-03 NOTE — H&P (Signed)
Author: Earnstine Regal, PA-C Service: Surgery Author Type: Physician Assistant   Filed: 06/02/2015 1:13 PM Note Time: 06/02/2015 11:20 AM Status: Attested   Editor: Brittany Regal, PA-C (Physician Assistant) Cosigner: Brittany Horn, [place] at 06/02/2015 4:57 PM   Attestation signed by Brittany Horn, [place] at 06/02/2015 4:57 PM  Pleasant lady with likely passage of CBD ductal stone without current symptoms. She had a previous episode of pancreatitis followed up by a ERCP at an outside hospital that showed a dilated CBD appropriate for age. More than likely she has passed stones a couple of times without completely obstructing. The chances of her having another attack is fairly high, so I would recommend that she have a cholecystectomy. However, because she is on Plavix Cardiology has recommended a five day washout of her Plavix, which means we cannot operate until Friday.  We can probably do this electively, stopping her Plavix as an outpatient and performing a Lap chole with extended outpatient stay.  Brittany Boyle. Brittany Bailiff, Boy[place], [place] [phone] Surgery     Expand All Collapse All   Reason for Consult: cholelithiasis Referring Physician: Dr. Reyne Boyle PCP: Brittany Boyle   Brittany Boyle is an 82 y.o. female.  HPI: Pt is a 82 y/o who presented to her with weakness, some nausea and vomiting, epigastric pain day prior. By the time she got to the ED here her pain had resolved. LFT were elevated in Dr. Verdie Drown office. She was admitted to Medicine on 05/30/15.  Work up in the ED showed she had a low grade temp 99. Bilirubin was 3.5, AST 799, ALT 435. Bilirubin went up to 4.1 on 3/26, but all her LFT's are returning to normal. WBC went from 11.5. To 4.5 today. CT scan on admit 05/30/15, Showed: Right adrenal gland mass with fat likely representing myelo lipoma. Vague infiltration in the root of the mesenteric possibly indicating  mesenteritis. Nonspecific distention of the bladder. Compression of the superior endplate of L3, possibly acute compression. She has been seen at [facility] and [facility] for other issues and is now here with Improving LFT's.  MRI on 06/01/15 shows: Mild common duct dilatation for age. Area of underdistention at and just above hte ampulla corresponding to post-contrast enhancement. Differential considerations include recent stone passage or a subtle ampullary lesion. Consider surveillance of liver function tests. If the bilirubin is persistently elevated, ERCP should be considered. 2. Right adrenal myelolipoma. 3. Probable mesenteric adenitis/panniculitis, of indeterminate acuity and significance. 4. L3 compression deformity, likely subacute.  Recent hospitalizations at [facility] and [facility]:  12/23-27/2016 Pancreatitis/acute cholecystitis at [facility]  1/17-20/20117 At [facility] with suspected seizure with work up finding a meningioma, vasogenic cerebral edema and hyponatremia. Neurosurgeon was consulted for meningioma with cerebral edema. Neurosurgeon was of the opinion that meningioma was chronic and vasogenic edema was minimal and therefore were not responsible for her seizures. Hyponatremia was treated with IV fluids and her sodium improved from 126 to 134 prior to discharge. There were no seizure episodes in the hospital. She was feeling better and she wanted to be discharged home. She was deemed stable for discharge to home with home PT.   We are ask to see.  Past Medical History  Diagnosis Date  . Hypertension   . Diabetes mellitus without complication ([place])   . Seizures [facility]     Past Surgical History  Procedure Laterality Date  . Coronary stent placement      06/01/15 pt  stated that she has a stent     History reviewed. No pertinent family history.  Social History:  reports that she has never smoked. She has never used smokeless  tobacco. She reports that she does not drink alcohol or use illicit drugs.  Allergies:  Allergies  Allergen Reactions  . Penicillins Rash    Has patient had a PCN reaction causing immediate rash, facial/tongue/throat swelling, SOB or lightheadedness with hypotension: Yes Has patient had a PCN reaction causing severe rash involving mucus membranes or skin necrosis: No Has patient had a PCN reaction that required hospitalization No Has patient had a PCN reaction occurring within the last 10 years: No If all of the above answers are "NO", then may proceed with Cephalosporin use.  Tobacco: Second hand, and none for 40+ years ETOH: None DRugs: None   Medications:  Prior to Admission:  Prescriptions prior to admission  Medication Sig Dispense Refill Last Dose  . aspirin EC 81 MG tablet Take 81 mg by mouth daily.   05/30/2015 at Unknown time  . clonazePAM (KLONOPIN) 0.5 MG tablet Take 0.5 mg by mouth 2 (two) times daily as needed for anxiety.   3 weeks ago  . clopidogrel (PLAVIX) 75 MG tablet Take 75 mg by mouth daily.   05/30/2015 at Unknown time  . cyclobenzaprine (FLEXERIL) 5 MG tablet Take 5 mg by mouth 3 (three) times daily as needed for muscle spasms.   2 weeks ago  . gabapentin (NEURONTIN) 100 MG capsule Take 100 mg by mouth at bedtime.   05/29/2015 at Unknown time  . levETIRAcetam (KEPPRA) 500 MG tablet Take 500 mg by mouth 2 (two) times daily.   05/30/2015 at 830  . losartan (COZAAR) 100 MG tablet Take 100 mg by mouth daily.   05/30/2015 at Unknown time  . metFORMIN (GLUCOPHAGE) 500 MG tablet Take 500 mg by mouth 2 (two) times daily with a meal.   05/30/2015 at am  . metoprolol succinate (TOPROL-XL) 50 MG 24 hr tablet Take 50 mg by mouth daily. Take with or immediately following a meal.   05/30/2015 at 830  . Multiple Vitamin (MULTIVITAMIN WITH MINERALS) TABS tablet Take 1 tablet by mouth daily with lunch.    05/29/2015 at Unknown time  . omeprazole (PRILOSEC) 20 MG capsule Take 20 mg by mouth daily.   05/30/2015 at Unknown time  . ondansetron (ZOFRAN) 4 MG tablet Take 4 mg by mouth every 8 (eight) hours as needed for nausea or vomiting.   05/30/2015 at 1000  . OVER THE COUNTER MEDICATION Place 1 drop into both eyes daily as needed (dry eyes). OTC lubricating eye drop   couple days ago  . senna-docusate (SENOKOT-S) 8.6-50 MG tablet Take 1 tablet by mouth at bedtime.   05/29/2015 at Unknown time  . sodium chloride 1 g tablet Take 1 g by mouth every other day.   05/30/2015 at Unknown time  . spironolactone (ALDACTONE) 25 MG tablet Take 25 mg by mouth daily.   05/30/2015 at Unknown time   Scheduled: . aspirin EC 81 mg Oral Daily  . enoxaparin (LOVENOX) injection 40 mg Subcutaneous Q24H  . gabapentin 100 mg Oral QHS  . insulin aspart 0-9 Units Subcutaneous TID WC  . levETIRAcetam 500 mg Oral BID  . losartan 100 mg Oral Daily  . metoprolol succinate 50 mg Oral Daily  . multivitamin with minerals 1 tablet Oral Q lunch  . pantoprazole 40 mg Oral Daily  . senna-docusate 1 tablet Oral QHS  .  sodium chloride 1 g Oral QODAY   Continuous: . sodium chloride 100 mL/hr at 06/02/15 0200   NOB:[id number], cyclobenzaprine, ondansetron (ZOFRAN) IV Anti-infectives    None       Lab Results Last 48 Hours    Results for orders placed or performed during the hospital encounter of 05/30/15 (from the past 48 hour(s))  Glucose, capillary Status: Abnormal   Collection Time: 05/31/15 12:14 PM  Result Value Ref Range   Glucose-Capillary 127 (H) 65 - 99 mg/dL  Glucose, capillary Status: Abnormal   Collection Time: 05/31/15 4:41 PM  Result Value Ref Range   Glucose-Capillary 179 (H) 65 - 99 mg/dL   Comment 1 Notify RN    Comment 2 Document in Chart   Glucose,  capillary Status: Abnormal   Collection Time: 05/31/15 9:20 PM  Result Value Ref Range   Glucose-Capillary 188 (H) 65 - 99 mg/dL  Glucose, capillary Status: Abnormal   Collection Time: 06/01/15 7:34 AM  Result Value Ref Range   Glucose-Capillary 136 (H) 65 - 99 mg/dL  Comprehensive metabolic panel Status: Abnormal   Collection Time: 06/01/15 7:46 AM  Result Value Ref Range   Sodium 134 (L) 135 - 145 mmol/L   Potassium 3.8 3.5 - 5.1 mmol/L   Chloride 102 101 - 111 mmol/L   CO2 24 22 - 32 mmol/L   Glucose, Bld 151 (H) 65 - 99 mg/dL   BUN 9 6 - 20 mg/dL   Creatinine, Ser 0.78 0.44 - 1.00 mg/dL   Calcium 9.0 8.9 - 10.3 mg/dL   Total Protein 5.9 (L) 6.5 - 8.1 g/dL   Albumin 3.1 (L) 3.5 - 5.0 g/dL   AST 103 (H) 15 - 41 U/L   ALT 197 (H) 14 - 54 U/L   Alkaline Phosphatase 74 38 - 126 U/L   Total Bilirubin 1.6 (H) 0.3 - 1.2 mg/dL   GFR calc non Af Amer >60 >60 mL/min   GFR calc Af Amer >60 >60 mL/min    Comment: (NOTE) The eGFR has been calculated using the CKD EPI equation. This calculation has not been validated in all clinical situations. eGFR's persistently <60 mL/min signify possible Chronic Kidney Disease.    Anion gap 8 5 - 15  Lipase, blood Status: None   Collection Time: 06/01/15 7:46 AM  Result Value Ref Range   Lipase 23 11 - 51 U/L  Glucose, capillary Status: Abnormal   Collection Time: 06/01/15 11:36 AM  Result Value Ref Range   Glucose-Capillary 130 (H) 65 - 99 mg/dL  Culture, blood (Routine X 2) w Reflex to ID Panel Status: None (Preliminary result)   Collection Time: 06/01/15 12:15 PM  Result Value Ref Range   Specimen Description BLOOD RIGHT ANTECUBITAL    Special Requests BOTTLES DRAWN AEROBIC AND ANAEROBIC 5CC    Culture PENDING    Report Status PENDING   Culture, blood (Routine X 2) w Reflex  to ID Panel Status: None (Preliminary result)   Collection Time: 06/01/15 12:25 PM  Result Value Ref Range   Specimen Description BLOOD RIGHT ANTECUBITAL    Special Requests BOTTLES DRAWN AEROBIC AND ANAEROBIC 5CC    Culture PENDING    Report Status PENDING   Glucose, capillary Status: Abnormal   Collection Time: 06/01/15 5:08 PM  Result Value Ref Range   Glucose-Capillary 109 (H) 65 - 99 mg/dL  Glucose, capillary Status: Abnormal   Collection Time: 06/01/15 10:48 PM  Result Value Ref Range   Glucose-Capillary 115 (H) 65 -  99 mg/dL  Comprehensive metabolic panel Status: Abnormal   Collection Time: 06/02/15 5:48 AM  Result Value Ref Range   Sodium 137 135 - 145 mmol/L   Potassium 4.1 3.5 - 5.1 mmol/L   Chloride 106 101 - 111 mmol/L   CO2 23 22 - 32 mmol/L   Glucose, Bld 121 (H) 65 - 99 mg/dL   BUN 13 6 - 20 mg/dL   Creatinine, Ser 0.74 0.44 - 1.00 mg/dL   Calcium 8.9 8.9 - 10.3 mg/dL   Total Protein 5.8 (L) 6.5 - 8.1 g/dL   Albumin 3.0 (L) 3.5 - 5.0 g/dL   AST 45 (H) 15 - 41 U/L   ALT 131 (H) 14 - 54 U/L   Alkaline Phosphatase 61 38 - 126 U/L   Total Bilirubin 0.9 0.3 - 1.2 mg/dL   GFR calc non Af Amer >60 >60 mL/min   GFR calc Af Amer >60 >60 mL/min    Comment: (NOTE) The eGFR has been calculated using the CKD EPI equation. This calculation has not been validated in all clinical situations. eGFR's persistently <60 mL/min signify possible Chronic Kidney Disease.    Anion gap 8 5 - 15  CBC Status: Abnormal   Collection Time: 06/02/15 5:48 AM  Result Value Ref Range   WBC 4.5 4.0 - 10.5 K/uL   RBC 3.80 (L) 3.87 - 5.11 MIL/uL   Hemoglobin 11.5 (L) 12.0 - 15.0 g/dL   HCT 34.5 (L) 36.0 - 46.0 %   MCV 90.8 78.0 - 100.0 fL   MCH 30.3 26.0 - 34.0 pg   MCHC 33.3 30.0 - 36.0 g/dL   RDW 13.3 11.5 -  15.5 %   Platelets 191 150 - 400 K/uL  Glucose, capillary Status: Abnormal   Collection Time: 06/02/15 8:20 AM  Result Value Ref Range   Glucose-Capillary 134 (H) 65 - 99 mg/dL       Imaging Results (Last 48 hours)    Mr 3d Recon At Scanner  06/01/2015 CLINICAL DATA: Elevated liver function tests. Diabetes. Hypertension. Nausea vomiting and epigastric abdominal pain. EXAM: MRI ABDOMEN WITHOUT AND WITH CONTRAST (INCLUDING MRCP) TECHNIQUE: Multiplanar multisequence MR imaging of the abdomen was performed both before and after the administration of intravenous contrast. Heavily T2-weighted images of the biliary and pancreatic ducts were obtained, and three-dimensional MRCP images were rendered by post processing. CONTRAST: 3m MULTIHANCE GADOBENATE DIMEGLUMINE 529 MG/ML IV SOLN COMPARISON: 05/31/2015 abdominal ultrasound. 05/30/2015 CT. FINDINGS: Lower chest: Mild cardiomegaly, without pericardial or pleural effusion. Hepatobiliary: Normal liver. Intrahepatic ducts upper normal for patient age. Normal gallbladder. Low cystic duct insertion. Common duct measures maximally 11 mm, including on image 40/series 6. Upper normal in this age group 9-10 mm. At and just above the ampulla, there is incomplete distension, including image 25/series 6. No well-defined common duct stone. On post-contrast images, there is hyperenhancement in this area, including on image 71/ series 11304. Pancreas: Normal, without mass or ductal dilatation. Spleen: Normal in size, without focal abnormality. Adrenals/Urinary Tract: Normal left adrenal gland. A right adrenal myelolipoma measures 4.2 x 3.1 cm. Normal kidneys, without hydronephrosis. Stomach/Bowel: Normal stomach and abdominal bowel loops. There are small nodes in the jejunal mesenteric fat with increased T1 signal within. Example image 70/ series 13004. Vascular/Lymphatic: Advanced aortic and branch vessel atherosclerosis. No retroperitoneal or  retrocrural adenopathy. Other: No ascites. Musculoskeletal: Hyperenhancement at the site of a subacute L3 compression deformity. IMPRESSION: 1. Mild common duct dilatation for age. Area of underdistention at and just above  hte ampulla corresponding to post-contrast enhancement. Differential considerations include recent stone passage or a subtle ampullary lesion. Consider surveillance of liver function tests. If the bilirubin is persistently elevated, ERCP should be considered. 2. Right adrenal myelolipoma. 3. Probable mesenteric adenitis/panniculitis, of indeterminate acuity and significance. 4. L3 compression deformity, likely subacute. Electronically Signed By: Abigail Miyamoto M.D. On: 06/01/2015 17:27   Mr Abd W/wo Cm/mrcp  06/01/2015 CLINICAL DATA: Elevated liver function tests. Diabetes. Hypertension. Nausea vomiting and epigastric abdominal pain. EXAM: MRI ABDOMEN WITHOUT AND WITH CONTRAST (INCLUDING MRCP) TECHNIQUE: Multiplanar multisequence MR imaging of the abdomen was performed both before and after the administration of intravenous contrast. Heavily T2-weighted images of the biliary and pancreatic ducts were obtained, and three-dimensional MRCP images were rendered by post processing. CONTRAST: 31m MULTIHANCE GADOBENATE DIMEGLUMINE 529 MG/ML IV SOLN COMPARISON: 05/31/2015 abdominal ultrasound. 05/30/2015 CT. FINDINGS: Lower chest: Mild cardiomegaly, without pericardial or pleural effusion. Hepatobiliary: Normal liver. Intrahepatic ducts upper normal for patient age. Normal gallbladder. Low cystic duct insertion. Common duct measures maximally 11 mm, including on image 40/series 6. Upper normal in this age group 9-10 mm. At and just above the ampulla, there is incomplete distension, including image 25/series 6. No well-defined common duct stone. On post-contrast images, there is hyperenhancement in this area, including on image 71/ series 11304. Pancreas: Normal, without mass or ductal  dilatation. Spleen: Normal in size, without focal abnormality. Adrenals/Urinary Tract: Normal left adrenal gland. A right adrenal myelolipoma measures 4.2 x 3.1 cm. Normal kidneys, without hydronephrosis. Stomach/Bowel: Normal stomach and abdominal bowel loops. There are small nodes in the jejunal mesenteric fat with increased T1 signal within. Example image 70/ series 13004. Vascular/Lymphatic: Advanced aortic and branch vessel atherosclerosis. No retroperitoneal or retrocrural adenopathy. Other: No ascites. Musculoskeletal: Hyperenhancement at the site of a subacute L3 compression deformity. IMPRESSION: 1. Mild common duct dilatation for age. Area of underdistention at and just above hte ampulla corresponding to post-contrast enhancement. Differential considerations include recent stone passage or a subtle ampullary lesion. Consider surveillance of liver function tests. If the bilirubin is persistently elevated, ERCP should be considered. 2. Right adrenal myelolipoma. 3. Probable mesenteric adenitis/panniculitis, of indeterminate acuity and significance. 4. L3 compression deformity, likely subacute. Electronically Signed By: KAbigail MiyamotoM.D. On: 06/01/2015 17:27   [place] Limited Ru[place]  05/31/2015 CLINICAL DATA: Elevated liver function test. EXAM: [place] LIMITED - RIGHT UPPER QUADRANT COMPARISON: CT 05/30/2015. FINDINGS: Gallbladder: Sludge in the gallbladder. Wall thickness mildly prominent 2.5 mm. Negative sonographic Murphy's sign. No layering stones. Common bile duct: Diameter: Mildly increased, 8 mm diameter. Liver: Increased echogenicity. Slight intrahepatic ductal dilatation. Ancillary finding: As noted from CT there is a large RIGHT adrenal mass, favored to represent myelolipoma. IMPRESSION: Gallbladder sludge without stones. Negative sonographic Murphy's sign. Mild intra and extra hepatic biliary ductal dilatation. Steatosis. Large RIGHT adrenal mass. Abnormal LFTs. Electronically Signed   By: JStaci RighterM.D. On: 05/31/2015 15:12     Review of Systems  Constitutional: Negative.  HENT: Negative.  Eyes: Negative.  Respiratory: Positive for cough and sputum production (clear white color). Negative for hemoptysis, shortness of breath and wheezing.  Cardiovascular: Positive for leg swelling (occasionally). Negative for chest pain, orthopnea and PND.  Gastrointestinal: Positive for heartburn (on PPI), nausea, vomiting, abdominal pain (all the above associated with admission) and constipation (chronic). Negative for diarrhea, blood in stool and melena.  Genitourinary: Negative.  Musculoskeletal: Positive for myalgias and joint pain.  Skin: Negative.  Neurological: Negative.  Endo/Heme/Allergies: Bruises/bleeds easily (on plavix).  Psychiatric/Behavioral: Negative.   Blood pressure 128/51, pulse 63, temperature 97.6 F (36.4 C), temperature source Oral, resp. rate 18, height _0  (1.651 m), weight 67.5 kg (148 lb 13 oz), SpO2 97 %. Physical Exam  Constitutional: She is oriented to person, place, and time. She appears well-developed and well-nourished. No distress.  HENT:  Head: Normocephalic.  Nose: Nose normal.  Mouth/Throat: No oropharyngeal exudate.  Eyes: Right eye exhibits no discharge. Left eye exhibits no discharge. No scleral icterus.  Neck: Normal range of motion. Neck supple. No JVD present. No tracheal deviation present. No thyromegaly present.  Cardiovascular: Normal rate, regular rhythm, normal heart sounds and intact distal pulses.  No murmur heard. Respiratory: Effort normal and breath sounds normal. No respiratory distress. She has no wheezes. She has no rales. She exhibits no tenderness.  GI: Soft. Bowel sounds are normal. She exhibits no distension and no mass. There is no tenderness. There is no rebound and no guarding.  Currently pain free.  Musculoskeletal: She exhibits no edema.  Lymphadenopathy:   She has no cervical adenopathy.   Neurological: She is alert and oriented to person, place, and time. No cranial nerve deficit.  Skin: Skin is warm and dry. No rash noted. She is not diaphoretic. No erythema. No pallor.  Psychiatric: She has a normal mood and affect. Her behavior is normal. Judgment and thought content normal.    Assessment/Plan: Abdominal pain and elevated LFT's - I think she passed another gallstone Cholelithiasis/sludge in gallbladder/CBD dilatations/GB wall thickening Pancreatitis Hospitalized 02/2015 Seizures/dehydration/hyponatremia Hospitalized 03/2015 Right adrenal mass (adrenal myelolipoma) Hypertension AODM Seizures with hyponatremia Hx of CAD/ Stent about 6 years ago On Plavix at home, last dose 05/31/15. Age 58  Plan: Pt would like to be considered for surgery. I think we will need to get Cardiology to see and clear. I will discuss with Dr. Hulen Skains.  JENNINGS,WILLARD 06/02/2015, 11:20 AM   The patient has been off of her Plavix for several days now and is doing fine.  Has not had another attack of abdominal pain.  Vital are good.  Brittany Boyle. Brittany Bailiff, Boy[place], [place] [phone] [facility]

## 2015-07-03 NOTE — Anesthesia Preprocedure Evaluation (Addendum)
Anesthesia Evaluation  Patient identified by MRN, date of birth, ID band Patient awake    Reviewed: Allergy & Precautions, NPO status , Patient's Chart, lab work & pertinent test results  Airway Mallampati: II  TM Distance: >3 FB Neck ROM: Full    Dental no notable dental hx.    Pulmonary neg pulmonary ROS,    Pulmonary exam normal breath sounds clear to auscultation       Cardiovascular hypertension, Pt. on medications and Pt. on home beta blockers + CAD and + Cardiac Stents (2011)  Normal cardiovascular exam Rhythm:Regular Rate:Normal     Neuro/Psych Seizures -,  negative psych ROS   GI/Hepatic negative GI ROS, Neg liver ROS,   Endo/Other  diabetes, Type 2, Oral Hypoglycemic Agents  Renal/GU negative Renal ROS  negative genitourinary   Musculoskeletal negative musculoskeletal ROS (+)   Abdominal   Peds negative pediatric ROS (+)  Hematology negative hematology ROS (+)   Anesthesia Other Findings   Reproductive/Obstetrics negative OB ROS                            Anesthesia Physical Anesthesia Plan  ASA: III  Anesthesia Plan: General   Post-op Pain Management:    Induction: Intravenous  Airway Management Planned: Oral ETT  Additional Equipment:   Intra-op Plan:   Post-operative Plan: Extubation in OR  Informed Consent: I have reviewed the patients History and Physical, chart, labs and discussed the procedure including the risks, benefits and alternatives for the proposed anesthesia with the patient or authorized representative who has indicated his/her understanding and acceptance.   Dental advisory given  Plan Discussed with: CRNA  Anesthesia Plan Comments:         Anesthesia Quick Evaluation

## 2015-07-03 NOTE — Discharge Instructions (Signed)
°  LAPAROSCOPIC SURGERY: POST OP INSTRUCTIONS  Always review your discharge instruction sheet given to you by the facility where your surgery was performed.  IF YOU HAVE DISABILITY OR FAMILY LEAVE FORMS, YOU MUST BRING THEM TO THE OFFICE FOR PROCESSING.  DO NOT GIVE THEM TO YOUR DOCTOR.  1. A prescription for pain medication may be given to you upon discharge. Take your pain medication as prescribed, if needed. If narcotic pain medicine is not needed, then you may take acetaminophen (Tylenol) or ibuprofen (Advil) as needed. 2. Take your usually prescribed medications unless otherwise directed. 3. If you need a refill on your pain medication, please contact your pharmacy. They will contact our office to request authorization. Prescriptions will not be filled after 5pm or on week-ends. 4. You should follow a light diet the first few days after arrival home, such as soup and crackers, etc. Be sure to include lots of fluids daily. 5. Most patients will experience some swelling and bruising in the area of the incisions. Ice packs will help. Swelling and bruising can take several days to resolve.  6. It is common to experience some constipation if taking pain medication after surgery. Increasing fluid intake and taking a stool softener (such as Colace) will usually help or prevent this problem from occurring. A mild laxative (Milk of Magnesia or Miralax) should be taken according to package instructions if there are no bowel movements after 48 hours. 7. Unless discharge instructions indicate otherwise, you may remove your bandages 24-48 hours after surgery, and you may shower at that time. You may have steri-strips (small skin tapes) in place directly over the incision. These strips should be left on the skin for 7-10 days. If your surgeon used skin glue on the incision, you may shower in 24 hours. The glue will flake off over the next 2-3 weeks. Any sutures or staples will be removed at the office during your  follow-up visit. 8. ACTIVITIES: You may resume regular (light) daily activities beginning the next day--such as daily self-care, walking, climbing stairs--gradually increasing activities as tolerated. You may have sexual intercourse when it is comfortable. Refrain from any heavy lifting or straining until approved by your doctor.  1. You may drive when you are no longer taking prescription pain medication, you can comfortably wear a seatbelt, and you can safely maneuver your car and apply brakes. 2. RETURN TO WORK: __________________________________________________________ 9. You should see your doctor in the office for a follow-up appointment approximately 2-3 weeks after your surgery. Make sure that you call for this appointment within a day or two after you arrive home to insure a convenient appointment time. 10. OTHER INSTRUCTIONS: __________________________________________________________________________________________________________________________ __________________________________________________________________________________________________________________________ WHEN TO CALL YOUR DOCTOR:  1. Fever over 101.0 2. Inability to urinate 3. Continued bleeding from incision. 4. Increased pain, redness, or drainage from the incision. 5. Increasing abdominal pain The clinic staff is available to answer your questions during regular business hours. Please dont hesitate to call and ask to speak to one of the nurses for clinical concerns. If you have a medical emergency, go to the nearest emergency room or call 911. A surgeon from [facility] is always on call at the hospital.  [address], [place], [place], [place] [address] ? [address], [place], [place] [address]  [phone] ? [phone] ? FAX [phone]  Web site: [email or web address]

## 2015-07-03 NOTE — Transfer of Care (Signed)
Immediate Anesthesia Transfer of Care Note  Patient: Brittany Boyle  Procedure(s) Performed: Procedure(s): LAPAROSCOPIC CHOLECYSTECTOMY WITH INTRAOPERATIVE CHOLANGIOGRAM (N/A)  Patient Location: PACU  Anesthesia Type:General  Level of Consciousness: awake, alert , oriented and patient cooperative  Airway & Oxygen Therapy: Patient Spontanous Breathing and Patient connected to face mask oxygen  Post-op Assessment: Report given to RN, Post -op Vital signs reviewed and stable, Patient moving all extremities and Patient moving all extremities X 4  Post vital signs: Reviewed and stable  Last Vitals:  Filed Vitals:   07/03/15 0613  BP: 137/53  Pulse: 66  Temp: 36.8 C  Resp: 20    Last Pain: There were no vitals filed for this visit.       Complications: No apparent anesthesia complications

## 2015-07-03 NOTE — Op Note (Signed)
OPERATIVE REPORT  DATE OF OPERATION: 07/03/2015  PATIENT:  Brittany Boyle  82 y.o. female  PRE-OPERATIVE DIAGNOSIS:  GALLSTONE PANCREATITIS, BILIARY COLIC  POST-OPERATIVE DIAGNOSIS:  GALLSTONE PANCREATITIS, BILIARY COLIC  FINDINGS:  Adhesions to the duodenum, normal IOC  PROCEDURE:  Procedure(s): LAPAROSCOPIC CHOLECYSTECTOMY WITH INTRAOPERATIVE CHOLANGIOGRAM  SURGEON:  Surgeon(s): Judeth Horn, MD  ASSISTANT: Hitchcock, RNFA  ANESTHESIA:   general  COMPLICATIONS:  None  EBL: <10 ml  BLOOD ADMINISTERED: none  DRAINS: none   SPECIMEN:  Source of Specimen:  Gallbladder and contents  COUNTS CORRECT:  YES  PROCEDURE DETAILS: The patient was taken to the operating room and placed on the table in the supine position.  After an adequate endotracheal anesthetic was administered, the patient was prepped with ChloroPrep, and then draped in the usual manner exposing the entire abdomen laterally, inferiorly and up  to the costal margins.  After a proper timeout was performed including identifying the patient and the procedure to be performed, a supraumbilical [id number] midline incision was made using a #15 blade.  This was taken down to the fascia which was then incised with a #15 blade.  The edges of the fascia were tented up with Kocher clamps as the preperitoneal space was penetrated with a Kelly clamp into the peritoneum.  Once this was done, a pursestring suture of 0 Vicryl was passed around the fascial opening.  This was subsequently used to secure the [facility] cannula which was passed into the peritoneal cavity.  Once the [facility] cannula was in place, carbon dioxide gas was insufflated into the peritoneal cavity up to a maximal intra-abdominal pressure of 29mm Hg.The laparoscope, with attached camera and light source, was passed into the peritoneal cavity to visualize the direct insertion of two right upper quadrant 56mm cannulas, and a sup-xiphoid 51mm cannula.  Once all cannulas were in place,  the dissection was begun.  Two ratcheted graspers were attached to the dome and infundibulum of the gallbladder and retracted towards the anterior abdominal wall and the right upper quadrant.  Using cautery attached to a dissecting forceps, the peritoneum overlaying the triangle of Chalot and the hepatoduodenal triangle was dissected away exposing the cystic duct and the cystic artery.  A critical window was developed between the CBD and the cystic duct The cystic artery was clipped proximally and distally then transected.  A clip was placed on the gallbladder side of the cystic duct, then a cholecystodochotomy made using the laparoscopic scissors.  Through the cholecystodochotomy a Cook catheter was passed to performed a cholangiogram.  The cholangiogram showed good flow into the duodenum, mildly dilated common bile duct, good proximal filling and no intraductal defects..  Once the cholangiogram was completed, the [facility] catheter was removed, and the distal cystic duct was clipped multiple times then transected between the clips.  The gallbladder was then dissected out of the hepatic bed without event.  It was retrieved from the abdomen (using an EndoCatch bag) without event.  Once the gallbladder was removed, the bed was inspected for hemostasis.  Once excellent hemostasis was obtained all gas and fluids were aspirated from above the liver, then the cannulas were removed.  The supraumbilical incision was closed using the pursestring suture which was in place.  0.25% bupivicaine with epinephrine was injected at all sites.  All 36mm or greater cannula sites were close using a running subcuticular stitch of 4-0 Monocryl.  5.79mm cannula sites were closed with Dermabond only.Steri-Strips and Tagaderm were used to complete the dressings at all  sites.      At this point all needle, sponge, and instrument counts were correct.The patient was awakened from anesthesia and taken to the PACU in stable  condition.   PATIENT DISPOSITION:  PACU - hemodynamically stable.   Dorissa Stinnette 4/28/20178:37 AM

## 2015-07-06 ENCOUNTER — Encounter (HOSPITAL_COMMUNITY): Payer: Self-pay | Admitting: General Surgery

## 2016-08-10 IMAGING — RF DG CHOLANGIOGRAM OPERATIVE
1 series · 5 of 5 positions shown · non-contrast
Comparison: MRCP 06/01/2015

CLINICAL DATA: [AGE] female with cholecystitis undergoing
laparoscopic cholecystectomy

EXAM:
INTRAOPERATIVE CHOLANGIOGRAM
TECHNIQUE: Cholangiographic images from the C-arm fluoroscopic device were
submitted for interpretation post-operatively. Please see the
procedural report for the amount of contrast and the fluoroscopy
time utilized.

[Series 1: run · 2 acquisitions, 5 frames shown]
[im 1/2]
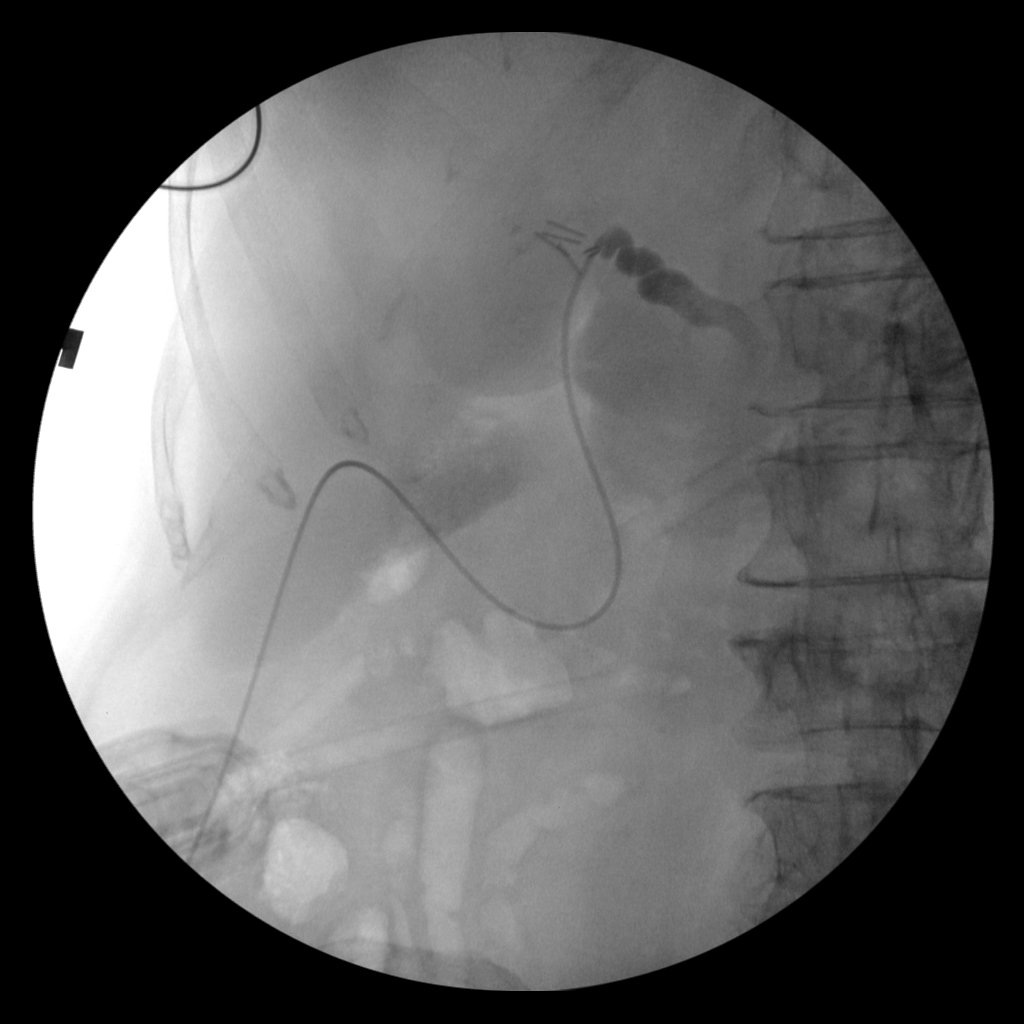
[im 1/2]
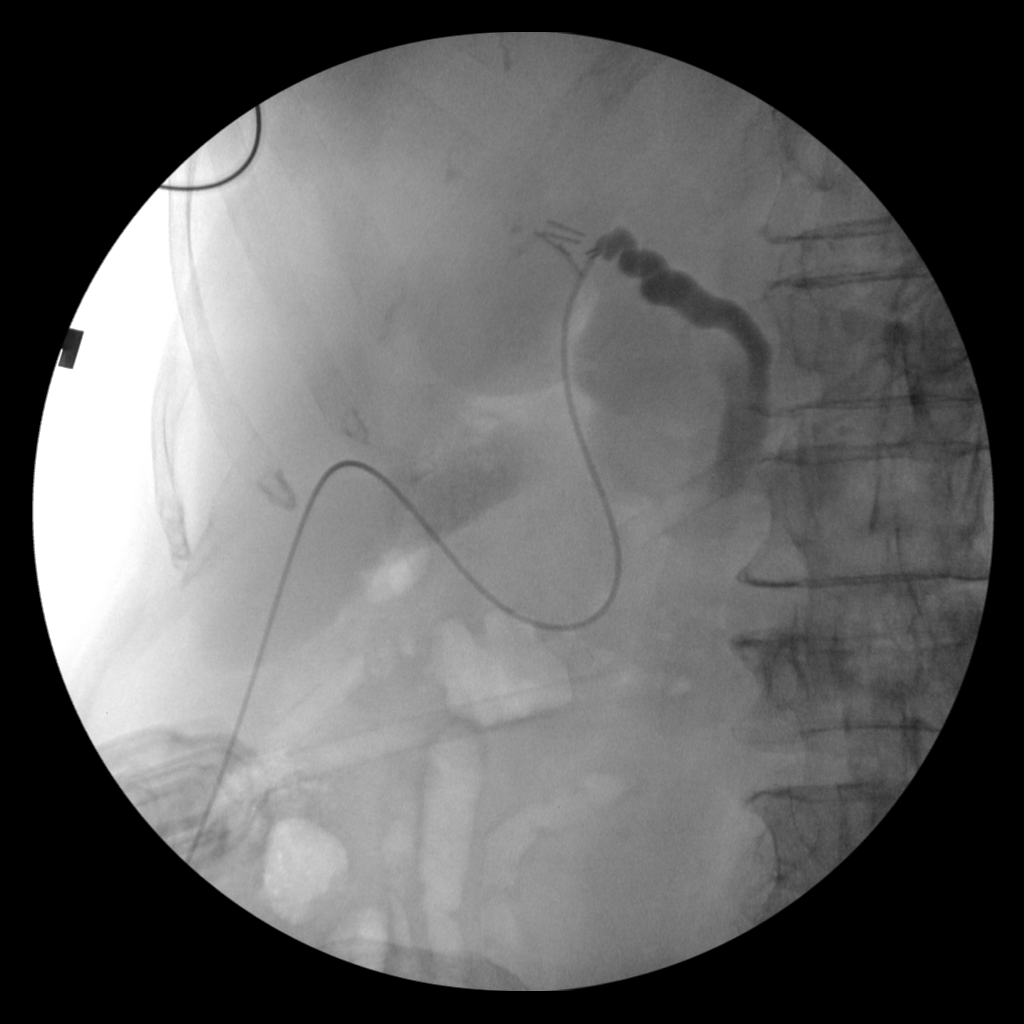
[im 1/2]
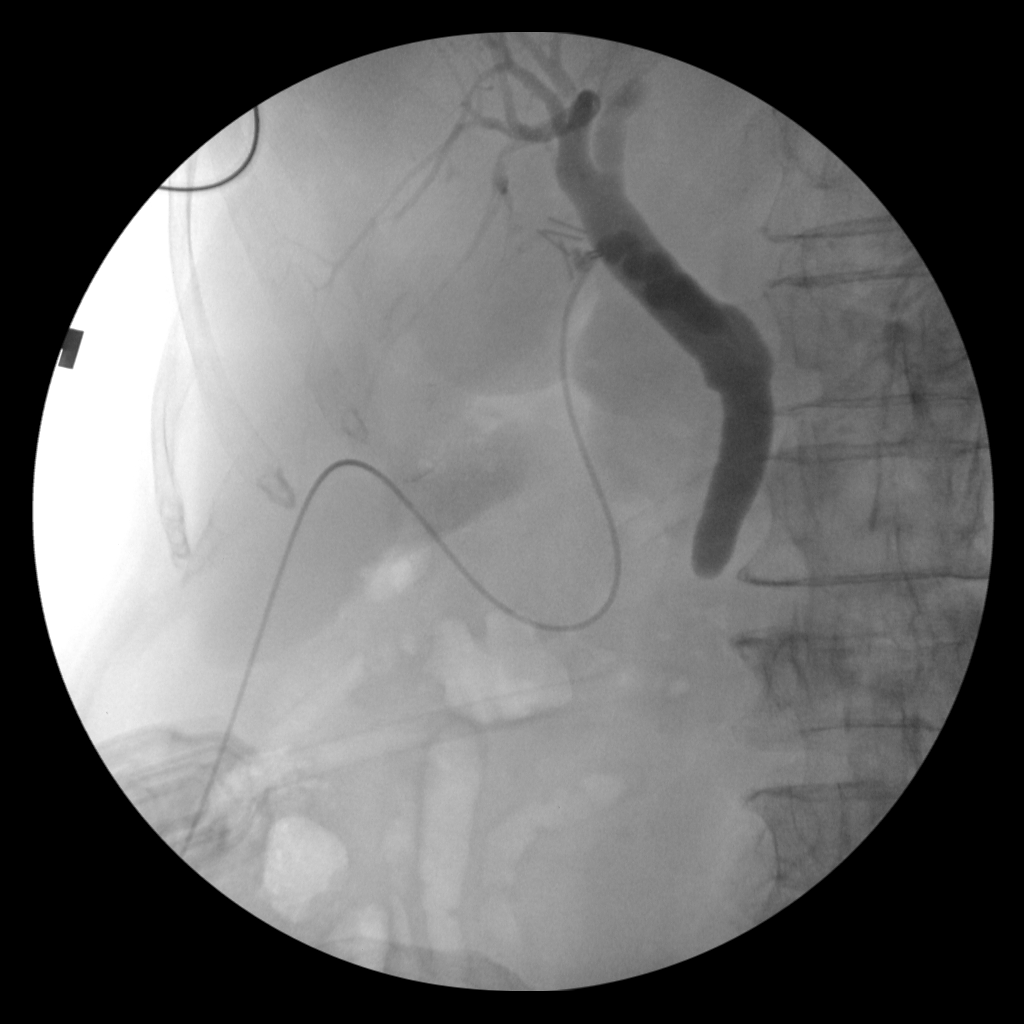
[im 1/2]
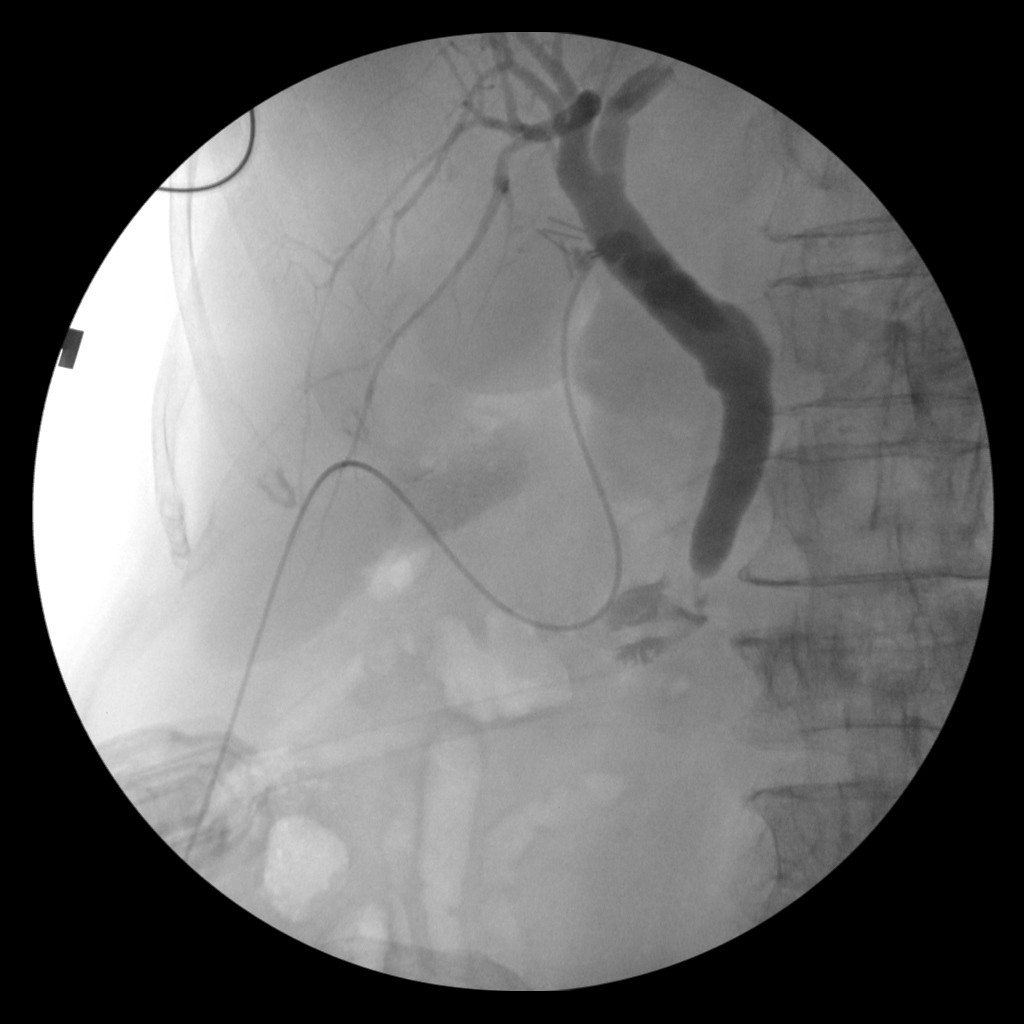
[im 2/2]
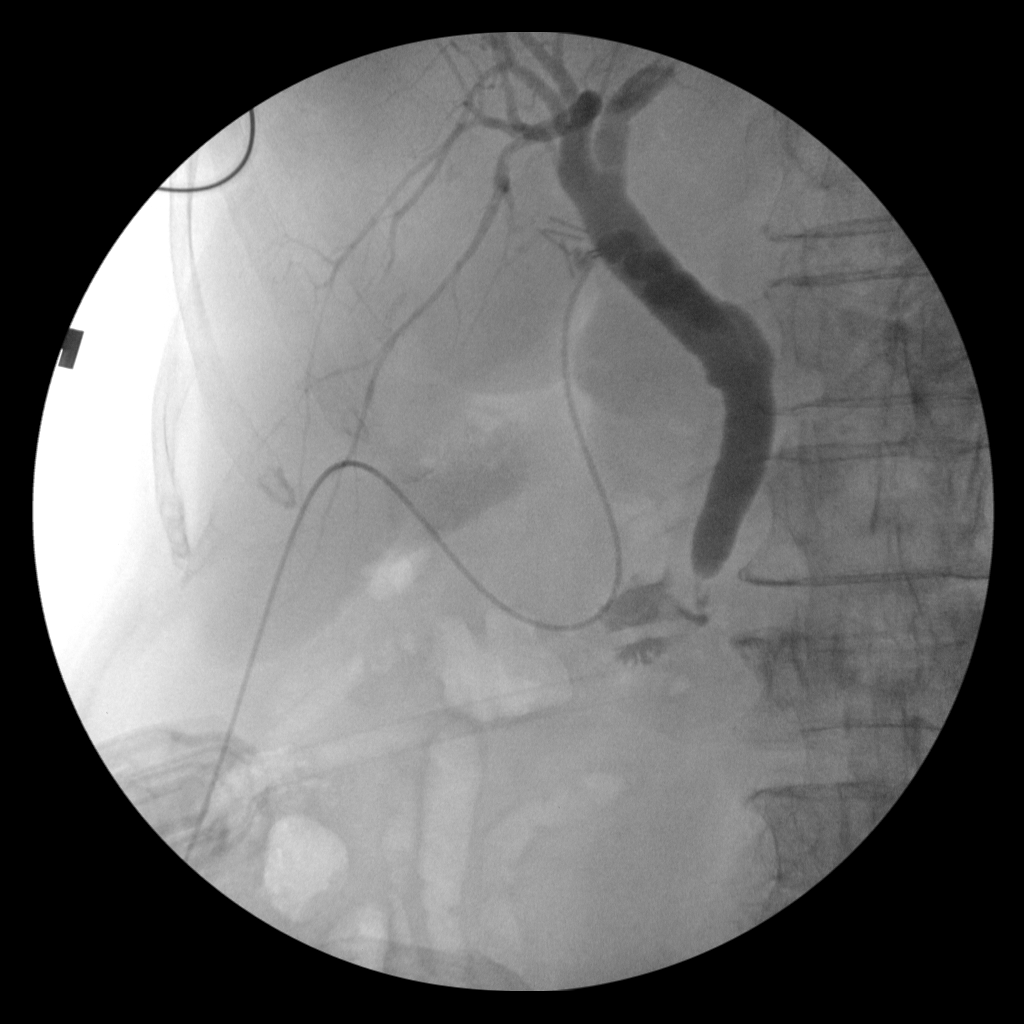

[5 of 5 positions shown; findings below may reference images not displayed]

FINDINGS: Cine clip and spot image obtained during intraoperative
cholangiogram at the time of laparoscopic cholecystectomy. The
images demonstrate cannulation of the cystic duct remnant and
opacification of the biliary tree. There is moderate dilatation of
the main hepatic ducts, the common hepatic duct and common bile
duct. However, no definite stenosis, stricture or
choledocholithiasis. Contrast material passes through the ampulla
and into the duodenum.
IMPRESSION: Predominantly extrahepatic biliary ductal dilatation without
evidence of stricture, stenosis or choledocholithiasis. The ampulla
remains patent.

## 2022-06-06 DEATH — deceased
